# Patient Record
Sex: Female | Born: 1970 | Hispanic: No | Marital: Single | Smoking: Never smoker
Health system: Southern US, Community
[De-identification: ages and names within clinical notes are randomized; demographics above are authoritative.]

---

## 2004-08-08 ENCOUNTER — Ambulatory Visit: Payer: Self-pay | Admitting: Obstetrics & Gynecology

## 2005-03-22 ENCOUNTER — Emergency Department: Payer: Self-pay | Admitting: Emergency Medicine

## 2005-03-28 ENCOUNTER — Emergency Department: Payer: Self-pay | Admitting: Internal Medicine

## 2006-05-07 ENCOUNTER — Emergency Department: Payer: Self-pay | Admitting: Emergency Medicine

## 2006-05-10 ENCOUNTER — Ambulatory Visit: Payer: Self-pay | Admitting: Obstetrics and Gynecology

## 2006-05-20 ENCOUNTER — Emergency Department: Payer: Self-pay | Admitting: Internal Medicine

## 2006-06-17 ENCOUNTER — Emergency Department: Payer: Self-pay | Admitting: Emergency Medicine

## 2006-08-26 ENCOUNTER — Encounter: Payer: Self-pay | Admitting: Maternal & Fetal Medicine

## 2006-10-07 ENCOUNTER — Encounter: Payer: Self-pay | Admitting: Maternal & Fetal Medicine

## 2006-11-18 ENCOUNTER — Encounter: Payer: Self-pay | Admitting: Maternal & Fetal Medicine

## 2007-01-01 ENCOUNTER — Observation Stay: Payer: Self-pay

## 2007-01-09 ENCOUNTER — Inpatient Hospital Stay: Payer: Self-pay

## 2008-04-25 ENCOUNTER — Emergency Department: Payer: Self-pay | Admitting: Emergency Medicine

## 2008-08-14 ENCOUNTER — Ambulatory Visit: Payer: Self-pay | Admitting: Family Medicine

## 2015-10-01 ENCOUNTER — Other Ambulatory Visit: Payer: Self-pay | Admitting: Family Medicine

## 2015-10-01 DIAGNOSIS — Z0000 Encounter for general adult medical examination without abnormal findings: Secondary | ICD-10-CM

## 2015-10-16 ENCOUNTER — Encounter (HOSPITAL_COMMUNITY): Payer: Self-pay

## 2015-10-16 ENCOUNTER — Ambulatory Visit
Admission: RE | Admit: 2015-10-16 | Discharge: 2015-10-16 | Disposition: A | Discharge status: Home / Self Care | Payer: Medicaid Other | Source: Ambulatory Visit | Attending: Family Medicine | Admitting: Family Medicine

## 2015-10-16 DIAGNOSIS — Z1231 Encounter for screening mammogram for malignant neoplasm of breast: Secondary | ICD-10-CM | POA: Diagnosis not present

## 2015-10-16 DIAGNOSIS — Z0000 Encounter for general adult medical examination without abnormal findings: Secondary | ICD-10-CM

## 2017-10-12 ENCOUNTER — Other Ambulatory Visit: Payer: Self-pay

## 2017-10-12 ENCOUNTER — Ambulatory Visit: Payer: Medicaid Other | Attending: Primary Care

## 2017-10-12 DIAGNOSIS — M545 Low back pain, unspecified: Secondary | ICD-10-CM

## 2017-10-12 DIAGNOSIS — G8929 Other chronic pain: Secondary | ICD-10-CM | POA: Diagnosis present

## 2017-10-12 DIAGNOSIS — M62838 Other muscle spasm: Secondary | ICD-10-CM | POA: Insufficient documentation

## 2017-10-12 DIAGNOSIS — M6281 Muscle weakness (generalized): Secondary | ICD-10-CM | POA: Diagnosis present

## 2017-10-12 NOTE — Patient Instructions (Signed)
Access Code: [email or web address]  URL: [email or web address]  Date: 10/12/2017  Prepared by: Olga Coasteriana Tabria Steines   Exercises  Standing Lumbar Extension with Counter - 10 reps - 1 sets - 3x daily - 7x weekly  Lumbar shift to the L 2-3 a day, 7x a week
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
## 2017-10-19 ENCOUNTER — Ambulatory Visit: Payer: Medicaid Other

## 2017-10-21 ENCOUNTER — Ambulatory Visit: Payer: Medicaid Other | Attending: Primary Care

## 2017-10-21 DIAGNOSIS — M62838 Other muscle spasm: Secondary | ICD-10-CM | POA: Diagnosis present

## 2017-10-21 DIAGNOSIS — M545 Low back pain: Secondary | ICD-10-CM | POA: Diagnosis not present

## 2017-10-21 DIAGNOSIS — M6281 Muscle weakness (generalized): Secondary | ICD-10-CM | POA: Diagnosis present

## 2017-10-21 DIAGNOSIS — G8929 Other chronic pain: Secondary | ICD-10-CM | POA: Insufficient documentation

## 2017-10-21 NOTE — Therapy (Signed)
[facility]TS [facility]ICINE [address] [place], [address] Phone: [phone]   Fax:  [phone]  Physical Therapy Treatment  Patient Details  Name: Dawn Oconnor MRN: [id number] Date of Birth: [birth year] Referring Provider (PT): Sandrea Hughs   Encounter Date: 10/21/2017  PT End of Session - 10/21/17 0753    Visit Number  2    Number of Visits  16    Date for PT Re-Evaluation  12/07/17    PT Start Time  0756    PT Stop Time  0849    PT Time Calculation (min)  53 min    Activity Tolerance  Patient limited by pain    Behavior During Therapy  [facility] for tasks assessed/performed       History reviewed. No pertinent past medical history.  History reviewed. No pertinent surgical history.  There were no vitals filed for this visit.  Subjective Assessment - 10/21/17 0757    Subjective  Patient reports that she is doing her HEP, moderate complaints of pain with extension exercise.    Currently in Pain?  Yes    Pain Score  7     Pain Location  Back    Pain Orientation  Lower;Right    Pain Descriptors / Indicators  Sore    Pain Type  Chronic pain    Pain Onset  More than a month ago        Treatment:  Therapeutic exercise:performed with verbal/visual cues from PT, goal; improve mobility, tissue length, core strength  Lumbar shift to L 3 x 30s in standing with demo from PT Extension in standing x10 with demon/verbal cues from PT Hip adduction isometric in s/l 15x3s holds with verbal cues (during modalities) LTR 8x3s holds tactile/verbal cues SKTC 3x20s hold b/l TrA activation in s/l 15x3s (during modalities) TrA activation in supine 5x3s   Modalities: x79mins High volt 100pps 2sec to lumbar area in s/l with moist hot pack(unbilled) for pain management, relaxation, tissue tension   Manual therapy: x56mins STM/IASTM to R posterior/lateral hip and lumbar paraspinals b/l for tissue tension, pain  management  Patient response to treatment: patient demonstrated improved technique with exercises with repeated demonstration and VC. Patient reported decreased tenderness at end of STM/IASTM, as well as decreased pain to 5-6/10 at end of session.    PT Education - 10/21/17 0759    Education Details  therex technique/form, HEP    Person(s) Educated  Patient    Methods  Explanation;Demonstration;Tactile cues    Comprehension  Verbalized understanding       PT Short Term Goals - 10/12/17 1716      PT SHORT TERM GOAL #1   Title  Patient will be complaint with HEP at least 3x a week to demonstrate ability to self manage condition at home.    Baseline  Patient currently unaware of HEP, HEP administered     Time  4    Period  Weeks    Status  New    Target Date  11/09/17        PT Long Term Goals - 10/12/17 1717      PT LONG TERM GOAL #1   Title  Patient will demonstrate lumbar ROM WFLs to improve ability to perform functional activities such as twisting, lifting, bending with pain 4/10 or less.     Baseline  See objective section for current ROM, pain ranges from 6/10-10/10 with movement currently.    Time  8  Period  Weeks    Status  New    Target Date  12/07/17      PT LONG TERM GOAL #2   Title  The patient will demonstrate transfers from sit to stand, and bed mobility with pain 4/10 or less to improve mobility and tolerance to functional activities.    Baseline  Patient unable to perform transfers with pain 4/10 currently.    Time  4    Period  Weeks    Status  New    Target Date  12/07/17      PT LONG TERM GOAL #3   Title  the patient will demonstrate an improvement in functional tasks via improvement in FOTO (outcome measure) by at least 10 points.    Baseline   FOTO 10/12/17: 63    Time  8    Period  Weeks    Status  New    Target Date  12/07/17      PT LONG TERM GOAL #4   Title  The patient will demonstrate at least 4/5 of MMT for LE strength to improve  ability to perform functional activities such as stair navigation, squatting, carrying groceries, etc.    Baseline  see objective section for current MMT results.    Time  8    Period  Weeks    Target Date  12/07/17      PT LONG TERM GOAL #5   Title  Patient will tolerate sitting unsupported demonstrating erect sitting posture with minimal thoracic kyphosis for 20+ minutes with maximum of 5/10 back pain to demonstrate improved back extensor strength and improved sitting tolerance.    Baseline  Currently only able to sit for with back pain 5/10 or less.     Time  8    Period  Weeks    Status  New    Target Date  12/07/17            Plan - 10/21/17 0850    Clinical Impression Statement  Patient demonstrated good understanding of transverse abdominis activation during session. S/L for most of session due to poor tolerance with prone/supine positioning. Patient reported decrease in lumbar pain to 5-6/10 at end of session.     PT Frequency  2x / week    PT Duration  8 weeks    PT Treatment/Interventions  ADLs/Self Care Home Management;Aquatic Therapy;Moist Heat;Parrafin;Gait training;Stair training;Functional mobility training;Neuromuscular re-education;Balance training;Therapeutic exercise;Therapeutic activities;Patient/family education;Scar mobilization;Passive range of motion;Manual techniques;Dry needling;Energy conservation;Joint Manipulations;Splinting;Taping;Spinal Manipulations    PT Next Visit Plan  stretches, light STM     PT Home Exercise Plan  lumbar shift correction, standing extension    Consulted and Agree with Plan of Care  Patient       Patient will benefit from skilled therapeutic intervention in order to improve the following deficits and impairments:  Decreased balance, Decreased endurance, Decreased mobility, Abnormal gait, Hypomobility, Increased muscle spasms, Decreased range of motion, Improper body mechanics, Decreased activity tolerance, Decreased strength,  Increased fascial restricitons, Impaired flexibility, Postural dysfunction, Pain  Visit Diagnosis: Chronic midline low back pain without sciatica  Muscle weakness (generalized)  Other muscle spasm     Problem List There are no active problems to display for this patient.   Olga Coaster PT, DPT 8:55 AM,10/21/17 [phone]  [facility] PHYSICAL AND SPORTS MEDICINE [address] [place], [address] Phone: [phone]   Fax:  [phone]  Name: Dawn Oconnor MRN: [id number] Date of Birth: March 18, 1970

## 2017-10-26 ENCOUNTER — Ambulatory Visit: Payer: Medicaid Other

## 2017-10-26 DIAGNOSIS — M545 Low back pain, unspecified: Secondary | ICD-10-CM

## 2017-10-26 DIAGNOSIS — M6281 Muscle weakness (generalized): Secondary | ICD-10-CM

## 2017-10-26 DIAGNOSIS — G8929 Other chronic pain: Secondary | ICD-10-CM

## 2017-10-26 DIAGNOSIS — M62838 Other muscle spasm: Secondary | ICD-10-CM
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
## 2017-10-28 ENCOUNTER — Ambulatory Visit: Payer: Medicaid Other

## 2017-10-28 DIAGNOSIS — M545 Low back pain: Secondary | ICD-10-CM | POA: Diagnosis not present

## 2017-10-28 DIAGNOSIS — G8929 Other chronic pain: Secondary | ICD-10-CM

## 2017-10-28 DIAGNOSIS — M62838 Other muscle spasm: Secondary | ICD-10-CM

## 2017-10-28 DIAGNOSIS — M6281 Muscle weakness (generalized): Secondary | ICD-10-CM
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
## 2017-11-02 ENCOUNTER — Encounter: Payer: Self-pay | Admitting: Physical Therapy

## 2017-11-02 ENCOUNTER — Ambulatory Visit: Payer: Medicaid Other | Admitting: Physical Therapy

## 2017-11-02 ENCOUNTER — Ambulatory Visit: Payer: Medicaid Other

## 2017-11-02 DIAGNOSIS — M545 Low back pain, unspecified: Secondary | ICD-10-CM

## 2017-11-02 DIAGNOSIS — M6281 Muscle weakness (generalized): Secondary | ICD-10-CM

## 2017-11-02 DIAGNOSIS — G8929 Other chronic pain: Secondary | ICD-10-CM

## 2017-11-02 DIAGNOSIS — M62838 Other muscle spasm: Secondary | ICD-10-CM
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
## 2017-11-04 ENCOUNTER — Ambulatory Visit: Payer: Medicaid Other

## 2017-11-04 ENCOUNTER — Other Ambulatory Visit: Payer: Self-pay

## 2017-11-04 DIAGNOSIS — M545 Low back pain: Principal | ICD-10-CM

## 2017-11-04 DIAGNOSIS — M6281 Muscle weakness (generalized): Secondary | ICD-10-CM

## 2017-11-04 DIAGNOSIS — G8929 Other chronic pain: Secondary | ICD-10-CM

## 2017-11-04 DIAGNOSIS — M62838 Other muscle spasm: Secondary | ICD-10-CM
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
## 2017-11-09 ENCOUNTER — Ambulatory Visit: Payer: Medicaid Other

## 2017-11-09 ENCOUNTER — Other Ambulatory Visit: Payer: Self-pay

## 2017-11-09 DIAGNOSIS — M545 Low back pain: Principal | ICD-10-CM

## 2017-11-09 DIAGNOSIS — G8929 Other chronic pain: Secondary | ICD-10-CM

## 2017-11-09 DIAGNOSIS — M62838 Other muscle spasm: Secondary | ICD-10-CM

## 2017-11-09 DIAGNOSIS — M6281 Muscle weakness (generalized): Secondary | ICD-10-CM
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
## 2017-11-11 ENCOUNTER — Encounter: Payer: Self-pay | Admitting: Physical Therapy

## 2017-11-11 ENCOUNTER — Ambulatory Visit: Payer: Medicaid Other | Admitting: Physical Therapy

## 2017-11-11 DIAGNOSIS — G8929 Other chronic pain: Secondary | ICD-10-CM

## 2017-11-11 DIAGNOSIS — M545 Low back pain: Principal | ICD-10-CM

## 2017-11-11 DIAGNOSIS — M62838 Other muscle spasm: Secondary | ICD-10-CM

## 2017-11-11 DIAGNOSIS — M6281 Muscle weakness (generalized): Secondary | ICD-10-CM
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
## 2017-11-16 ENCOUNTER — Other Ambulatory Visit: Payer: Self-pay

## 2017-11-16 ENCOUNTER — Ambulatory Visit: Payer: Medicaid Other

## 2017-11-16 DIAGNOSIS — M545 Low back pain: Principal | ICD-10-CM

## 2017-11-16 DIAGNOSIS — G8929 Other chronic pain: Secondary | ICD-10-CM

## 2017-11-16 DIAGNOSIS — M62838 Other muscle spasm: Secondary | ICD-10-CM

## 2017-11-16 DIAGNOSIS — M6281 Muscle weakness (generalized): Secondary | ICD-10-CM
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
## 2017-11-18 ENCOUNTER — Encounter: Payer: Self-pay | Admitting: Physical Therapy

## 2017-11-18 ENCOUNTER — Ambulatory Visit: Payer: Medicaid Other | Admitting: Physical Therapy

## 2017-11-18 DIAGNOSIS — M62838 Other muscle spasm: Secondary | ICD-10-CM

## 2017-11-18 DIAGNOSIS — M6281 Muscle weakness (generalized): Secondary | ICD-10-CM

## 2017-11-18 DIAGNOSIS — M545 Low back pain, unspecified: Secondary | ICD-10-CM

## 2017-11-18 DIAGNOSIS — G8929 Other chronic pain: Secondary | ICD-10-CM
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
## 2017-11-23 ENCOUNTER — Other Ambulatory Visit: Payer: Self-pay

## 2017-11-23 ENCOUNTER — Ambulatory Visit: Payer: Medicaid Other | Attending: Primary Care

## 2017-11-23 DIAGNOSIS — M6281 Muscle weakness (generalized): Secondary | ICD-10-CM | POA: Insufficient documentation

## 2017-11-23 DIAGNOSIS — M545 Low back pain, unspecified: Secondary | ICD-10-CM

## 2017-11-23 DIAGNOSIS — G8929 Other chronic pain: Secondary | ICD-10-CM | POA: Diagnosis present

## 2017-11-23 DIAGNOSIS — M62838 Other muscle spasm: Secondary | ICD-10-CM | POA: Diagnosis present
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
## 2017-11-25 ENCOUNTER — Ambulatory Visit: Payer: Medicaid Other | Admitting: Physical Therapy

## 2017-11-25 ENCOUNTER — Ambulatory Visit: Payer: Medicaid Other

## 2017-11-25 ENCOUNTER — Other Ambulatory Visit: Payer: Self-pay

## 2017-11-25 DIAGNOSIS — G8929 Other chronic pain: Secondary | ICD-10-CM

## 2017-11-25 DIAGNOSIS — M62838 Other muscle spasm: Secondary | ICD-10-CM

## 2017-11-25 DIAGNOSIS — M545 Low back pain, unspecified: Secondary | ICD-10-CM

## 2017-11-25 DIAGNOSIS — M6281 Muscle weakness (generalized): Secondary | ICD-10-CM
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
## 2017-11-30 ENCOUNTER — Ambulatory Visit: Payer: Medicaid Other | Admitting: Physical Therapy

## 2017-11-30 ENCOUNTER — Encounter: Payer: Self-pay | Admitting: Physical Therapy

## 2017-11-30 DIAGNOSIS — M545 Low back pain: Secondary | ICD-10-CM | POA: Diagnosis not present
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
## 2017-12-02 ENCOUNTER — Encounter: Payer: Self-pay | Admitting: Physical Therapy

## 2017-12-02 ENCOUNTER — Ambulatory Visit: Payer: Medicaid Other | Admitting: Physical Therapy

## 2017-12-02 ENCOUNTER — Ambulatory Visit: Payer: Medicaid Other

## 2017-12-07 ENCOUNTER — Ambulatory Visit: Payer: Medicaid Other

## 2017-12-07 ENCOUNTER — Other Ambulatory Visit: Payer: Self-pay

## 2017-12-07 DIAGNOSIS — M545 Low back pain: Principal | ICD-10-CM

## 2017-12-07 DIAGNOSIS — G8929 Other chronic pain: Secondary | ICD-10-CM

## 2017-12-07 DIAGNOSIS — M62838 Other muscle spasm: Secondary | ICD-10-CM

## 2017-12-07 DIAGNOSIS — M6281 Muscle weakness (generalized): Secondary | ICD-10-CM
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
## 2017-12-09 ENCOUNTER — Encounter: Payer: Self-pay | Admitting: Physical Therapy

## 2017-12-09 ENCOUNTER — Ambulatory Visit: Payer: Medicaid Other

## 2017-12-09 ENCOUNTER — Ambulatory Visit: Payer: Medicaid Other | Admitting: Physical Therapy

## 2017-12-09 ENCOUNTER — Telehealth: Payer: Self-pay | Admitting: Physical Therapy

## 2017-12-09 DIAGNOSIS — M6281 Muscle weakness (generalized): Secondary | ICD-10-CM

## 2017-12-09 DIAGNOSIS — M62838 Other muscle spasm: Secondary | ICD-10-CM

## 2017-12-09 DIAGNOSIS — M545 Low back pain: Principal | ICD-10-CM

## 2017-12-09 DIAGNOSIS — G8929 Other chronic pain: Secondary | ICD-10-CM

## 2017-12-09 NOTE — Telephone Encounter (Signed)
Called referring provider's office to ask for update on referral to orthopedist/MRI that patient reported she was waiting for. Spoke to personnel who checked her chart and did not find evidence of such referral. Stated Dr. Letta PateAycock may want to call me back to discuss (pt reported Dr. Letta PateAycock was the last person she saw at that office and who told her a referral was being made). Also reported that I was calling due to pt continuing to worsen and let them know I am discharging pt from PT due to lack of progress and had referred her back to them for further evaluation for her condition.
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
## 2018-02-08 DIAGNOSIS — M5440 Lumbago with sciatica, unspecified side: Secondary | ICD-10-CM | POA: Insufficient documentation

## 2019-10-10 ENCOUNTER — Other Ambulatory Visit: Payer: Self-pay | Admitting: Family Medicine

## 2019-10-10 DIAGNOSIS — Z1231 Encounter for screening mammogram for malignant neoplasm of breast: Secondary | ICD-10-CM

## 2019-10-16 ENCOUNTER — Other Ambulatory Visit: Payer: Self-pay | Admitting: Physical Medicine & Rehabilitation

## 2019-10-16 DIAGNOSIS — G8929 Other chronic pain: Secondary | ICD-10-CM

## 2019-10-16 DIAGNOSIS — M5442 Lumbago with sciatica, left side: Secondary | ICD-10-CM

## 2019-10-23 ENCOUNTER — Ambulatory Visit: Payer: Medicaid Other | Attending: Physical Medicine & Rehabilitation

## 2019-10-23 ENCOUNTER — Other Ambulatory Visit: Payer: Self-pay

## 2019-10-23 DIAGNOSIS — M5441 Lumbago with sciatica, right side: Secondary | ICD-10-CM | POA: Insufficient documentation

## 2019-10-23 DIAGNOSIS — G8929 Other chronic pain: Secondary | ICD-10-CM | POA: Insufficient documentation

## 2019-10-23 DIAGNOSIS — M5442 Lumbago with sciatica, left side: Secondary | ICD-10-CM | POA: Insufficient documentation

## 2019-10-23 DIAGNOSIS — M6281 Muscle weakness (generalized): Secondary | ICD-10-CM | POA: Insufficient documentation

## 2019-10-23 DIAGNOSIS — M62838 Other muscle spasm: Secondary | ICD-10-CM | POA: Diagnosis present

## 2019-10-23 DIAGNOSIS — M545 Low back pain, unspecified: Secondary | ICD-10-CM | POA: Diagnosis present

## 2019-10-23 NOTE — Therapy (Signed)
[facility] [address] [place], [place], [address] Phone: [phone]   Fax:  [phone]  Physical Therapy Evaluation  Patient Details  Name: Dawn Oconnor MRN: [id number] Date of Birth: 1970-07-24 Referring Provider (PT): Girtha Hake, MD   Encounter Date: 10/23/2019   PT End of Session - 10/23/19 1431    Visit Number 1    Number of Visits 9    Date for PT Re-Evaluation 11/20/19    PT Start Time 0258    PT Stop Time 1515    PT Time Calculation (min) 44 min    Activity Tolerance Patient limited by pain;Patient tolerated treatment well    Behavior During Therapy [facility] for tasks assessed/performed           History reviewed. No pertinent past medical history.  History reviewed. No pertinent surgical history.  There were no vitals filed for this visit.    Subjective Assessment - 10/23/19 1439    Subjective Pt. is a 49 y.o. female with chronic bilat back pain, 8/10 for about 3-4 years. Pt. additionally has sx that come down both legs, more consistently on the R, but has been starting more on the L. Pt. describes sx as numbness, shooting pain down to knee. Pt. states that her left leg has been buckling for past 6 months. Pt. has had MRI imaging of spine with nothing notable found. Pt. experiences most pain with sitting and supine activities. Pt. states that standing or walking is the most comfortable position. Pt. states she has completed aquatic therapy which was beneficial. Pt. states that she struggles with sleeping, and struggles with urinary incontinence recently. She has not seen an MD for urinary incontinence. Pt. states that gabapentin, voltarin, and naproxen can help her sleep. Pt. works as a Educational psychologist. Pt. states that it is not full time. Pt. states that after about 5-10 mins of sitting or laying down she feels sx starting from low back radiating down to above knees. Pt. additionally states that she feels her  memory has been getting worse recently. Pt. states that occasionally when she's walking or laying down she will feel dizzy, and experience blurred vision.    Pertinent History Pt has 67 year old daughter, works as English as a second language teacher    Limitations Sitting    How long can you sit comfortably? 5-10 mins    Diagnostic tests MRI spine, clear    Patient Stated Goals relieve pain    Currently in Pain? Yes    Pain Score 7     Pain Location Back    Pain Orientation Lower;Medial    Pain Onset More than a month ago                OBJECTIVE  Mental Status Patient is oriented to person, place and time.  Recent memory is intact.  Remote memory is intact.  Attention span and concentration are intact.  Expressive speech is intact.  Patient's fund of knowledge is within normal limits for educational level.  SENSATION: L5,S1 difference side to side   Gait Pt walks with decreased gait speed and decreased step length bilat.   Palpation Concurrent pain with palpation on R and L paraspinals at L3-L5 level  Strength (out of 5) Pt limited by pain in all motions and increase in pain with sitting. Pt demonstrated decreased strength in bilat LE.    AROM (degrees) R/L Lumbar forward flexion: significantly diminished, increase in pain noted. Lumbar extension: significantly  decreased, improves sx.  Lumbar lateral flexion: grossly the same, increase in pain on L side going both directions. Thoracic and Lumbar rotation: decreased bilat, increase in pain on L and R low back.  Repeated Movements Increase in sx with flexion, decrease in sx with repeated ext.    Special Test: Slump test positive bilat.           Objective measurements completed on examination: See above findings.                 PT Short Term Goals - 12/09/17 1154      PT SHORT TERM GOAL #1   Title Patient will be complaint with HEP at least 3x a week to demonstrate ability to self manage condition at  home.    Baseline unable to tolerate due to pain (12/09/2017)    Time 2    Period Weeks    Status Not Met    Target Date 11/22/17             PT Long Term Goals - 10/23/19 1526      PT LONG TERM GOAL #1   Title Pt. will improve sitting tolerance to at least 15 minutes with pain less thatn 4/10 to handle to commute to work.    Baseline 10/4: pt unalbe to sit for more than 1 minute without pain up to 8/10    Time 4    Period Weeks    Status New    Target Date 11/20/19      PT LONG TERM GOAL #2   Title Pt. will be able to lift 20 lbs from the ground to improve ability to complete work related tasks.    Baseline 10/4: pt unalbe to lift 20lbs from ground    Time 4    Period Weeks    Status New    Target Date 11/20/19      PT LONG TERM GOAL #3   Title Pt. will improve FOTO score to 60 to improve pain free functional mobility.    Baseline 10/4: 56    Time 4    Period Weeks    Status New    Target Date 11/20/19      PT LONG TERM GOAL #4   Title Pt. will demonstrate WFL spinal flexion to improve ability to complete ADLs.    Baseline 10/4: pt unable to tie shoes due to inadequate spinal flexion    Time 4    Period Weeks    Status New    Target Date 11/20/19      PT LONG TERM GOAL #5   Time --    Period --    Status --    Target Date --                  Plan - 10/24/19 0818    Clinical Impression Statement Pt. is a 49 y.o. female with c/o of chronic back pain that radiates down both legs, worse on the R than L. Pt. describes severe pain in low back, 8/10. Pt. exhibits differences in sensation at L5 and S1 levels in LE. Pt. is tender to palpation with concurrent pain on R and L paraspinals at L3-L5 level. PT unable to formally assess LE strength due to significant increase in pain with sitting and with all LE motions. Pt. exhibits positive slump test on both LEs. Pt. demonstrates centralization of sx with repeated spinal extension. Pt. has mentioned new onset  urinary incontinecne issues over  the past few weeks. Pt. will receive MRI for her spine on 10/13. Pt. will continue to benefit from skilled PT to improve pain and improve ability to complete ADLs independently.    Examination-Activity Limitations Bed Mobility;Caring for Others;Carry;Squat;Sit    Stability/Clinical Decision Making Evolving/Moderate complexity    Clinical Decision Making Moderate    Rehab Potential Fair    PT Frequency 2x / week    PT Duration 4 weeks    PT Treatment/Interventions ADLs/Self Care Home Management;Aquatic Therapy;Moist Heat;Parrafin;Gait training;Stair training;Functional mobility training;Neuromuscular re-education;Balance training;Therapeutic exercise;Therapeutic activities;Patient/family education;Scar mobilization;Passive range of motion;Manual techniques;Dry needling;Energy conservation;Joint Manipulations;Splinting;Taping;Spinal Manipulations;Cryotherapy    PT Next Visit Plan prone or standing extension    Consulted and Agree with Plan of Care Patient           Patient will benefit from skilled therapeutic intervention in order to improve the following deficits and impairments:  Decreased balance, Decreased endurance, Decreased mobility, Abnormal gait, Hypomobility, Increased muscle spasms, Decreased range of motion, Improper body mechanics, Decreased activity tolerance, Decreased strength, Impaired flexibility, Postural dysfunction, Pain  Visit Diagnosis: Chronic midline low back pain with bilateral sciatica  Muscle weakness (generalized)  Other muscle spasm     Problem List There are no problems to display for this patient.   Carlyle Basques, SPT 10/24/2019, 8:51 AM  [facility] [address] [place], [place], [address] Phone: [phone]   Fax:  [phone]  Name: Dawn Oconnor MRN: [id number] Date of Birth: 23-Apr-1970

## 2019-10-24 ENCOUNTER — Encounter: Payer: Self-pay | Admitting: Physical Therapy

## 2019-10-25 ENCOUNTER — Encounter: Payer: Self-pay | Admitting: Physical Therapy

## 2019-10-25 ENCOUNTER — Ambulatory Visit: Payer: Medicaid Other | Admitting: Physical Therapy

## 2019-10-25 ENCOUNTER — Other Ambulatory Visit: Payer: Self-pay

## 2019-10-25 DIAGNOSIS — M6281 Muscle weakness (generalized): Secondary | ICD-10-CM

## 2019-10-25 DIAGNOSIS — M5442 Lumbago with sciatica, left side: Secondary | ICD-10-CM

## 2019-10-25 DIAGNOSIS — M5441 Lumbago with sciatica, right side: Secondary | ICD-10-CM | POA: Diagnosis not present

## 2019-10-25 DIAGNOSIS — M62838 Other muscle spasm: Secondary | ICD-10-CM

## 2019-10-25 DIAGNOSIS — G8929 Other chronic pain: Secondary | ICD-10-CM

## 2019-10-25 NOTE — Therapy (Signed)
[place] [facility] [address] [place], [place], [address] Phone: [phone]   Fax:  [phone]  Physical Therapy Treatment  Patient Details  Name: Dawn Oconnor MRN: [id number] Date of Birth: 09/27/1970 Referring Provider (PT): Girtha Hake, MD   Encounter Date: 10/25/2019   PT End of Session - 10/25/19 1441    Visit Number 2    Number of Visits 9    Date for PT Re-Evaluation 11/20/19    PT Start Time 5038    PT Stop Time 1515    PT Time Calculation (min) 38 min    Activity Tolerance Patient limited by pain;Patient tolerated treatment well    Behavior During Therapy [facility] for tasks assessed/performed           History reviewed. No pertinent past medical history.  History reviewed. No pertinent surgical history.  There were no vitals filed for this visit.   Subjective Assessment - 10/25/19 1438    Subjective Pt. states that her pain is 6/10 in low back. Not currently feeling any sx down her legs. Earlier today, she felt radicular sx down her R leg during standing while doing dishes.    Pertinent History Pt has 75 year old daughter, works as English as a second language teacher    Limitations Sitting    How long can you sit comfortably? 5-10 mins    Diagnostic tests MRI spine, clear    Patient Stated Goals relieve pain    Currently in Pain? Yes    Pain Score 6     Pain Location Back    Pain Orientation Lower;Medial    Pain Descriptors / Indicators Aching;Tingling    Pain Onset More than a month ago           Therex:   Prone pushups: pt only able to lift head and shoulders off mat. Pt reported varying areas of pain while in prone, moved between upper thoracic paraspinals and L low back muscles that began radicular pain. PT attempted placing pillow under forearms to decrease pain felt with upper thoracic spine by pushing up, no relief found. PT attempted STM to lumbar and thoracic paraspinals where pt was experiencing pain. Directed  deep breathing helped pt with pain the most. Discontinued due to no relief and increase in pain over entire back.   Standing spinal extension hands on wall: pt attempted twice, increased pain and radicular sx. Discontinued due to pain.  Standing rolling green theraball up wall: pt experienced slight centralization of sx with this activity. Completed 10x with 5 sec holds.   Standing ext against green theraball on wall: 10x 5 sec holds. Cueing for deep breathing throughout.        PT Short Term Goals - 51/21/19 1154      PT SHORT TERM GOAL #1   Title Patient will be complaint with HEP at least 3x a week to demonstrate ability to self manage condition at home.    Baseline unable to tolerate due to pain (12/09/2017)    Time 2    Period Weeks    Status Not Met    Target Date 11/22/49             PT Long Term Goals - 10/23/19 1526      PT LONG TERM GOAL #1   Title Pt. will demonstrate handling at least 15 minutes of sitting with pain less than 4/10 to handle to commute to work.    Baseline 10/4: pt unalbe to sit for  more than 5 minute without pain up to 8/10    Time 4    Period Weeks    Status New    Target Date 11/20/19      PT LONG TERM GOAL #2   Title Pt. will be able to lift 20 lbs from the ground to improve ability to complete work related tasks.    Baseline 10/4: pt unalbe to lift 20lbs from ground    Time 4    Period Weeks    Status New    Target Date 11/20/19      PT LONG TERM GOAL #3   Title Pt. will improve FOTO score to 60 to improve pain free functional mobility.    Baseline 10/4: 56    Time 4    Period Weeks    Status New    Target Date 11/20/19      PT LONG TERM GOAL #4   Title Pt. will demonstrate WFL spinal flexion to improve ability to complete ADLs.    Baseline 10/4: pt unable to tie shoes due to inadequate spinal flexion    Time 4    Period Weeks    Status New    Target Date 11/20/19      PT LONG TERM GOAL #5   Time --    Period --     Status --    Target Date --                 Plan - 10/25/19 1520    Clinical Impression Statement Pt. returns to therapy with no change in sx. Pt. demonstrated improved ability to sit and lie prone during today's session. Pt attempted prone push ups and standing facing wall lumbar ext with no resolution of sx. Pt. experienced centralization of sx with standing rolling theraball up wall and ball on wall spinal extension. Pt. will receive MRI next week of spine. Pt. will continue to benefit from skilled PT to improve pain and improve ability to complete ADLs independently.    Personal Factors and Comorbidities Time since onset of injury/illness/exacerbation    Examination-Activity Limitations Bed Mobility;Caring for Others;Carry;Squat;Sit    Examination-Participation Restrictions Cleaning;Community Activity;Laundry    Stability/Clinical Decision Making Evolving/Moderate complexity    Clinical Decision Making Moderate    Rehab Potential Fair    PT Frequency 2x / week    PT Duration 4 weeks    PT Treatment/Interventions ADLs/Self Care Home Management;Aquatic Therapy;Moist Heat;Parrafin;Gait training;Stair training;Functional mobility training;Neuromuscular re-education;Balance training;Therapeutic exercise;Therapeutic activities;Patient/family education;Scar mobilization;Passive range of motion;Manual techniques;Dry needling;Energy conservation;Joint Manipulations;Splinting;Taping;Spinal Manipulations;Cryotherapy    PT Next Visit Plan prone or standing extension    Consulted and Agree with Plan of Care Patient           Patient will benefit from skilled therapeutic intervention in order to improve the following deficits and impairments:  Decreased balance, Decreased endurance, Decreased mobility, Abnormal gait, Hypomobility, Increased muscle spasms, Decreased range of motion, Improper body mechanics, Decreased activity tolerance, Decreased strength, Impaired flexibility, Postural  dysfunction, Pain  Visit Diagnosis: Chronic midline low back pain with bilateral sciatica  Muscle weakness (generalized)  Other muscle spasm  Chronic midline low back pain without sciatica     Problem List There are no problems to display for this patient.  Cammie Mcgee, PT, DPT # [id number] Sharyn Creamer, SPT 10/26/2019, 12:39 PM  [place] [facility] [address] [place], [place], [address] Phone: [phone]   Fax:  [phone]  Name: Dawn Linden  Oconnor MRN: [id number] Date of Birth: 03-27-1970

## 2019-10-30 ENCOUNTER — Encounter: Payer: Self-pay | Admitting: Physical Therapy

## 2019-10-30 ENCOUNTER — Other Ambulatory Visit: Payer: Self-pay

## 2019-10-30 ENCOUNTER — Ambulatory Visit: Payer: Medicaid Other

## 2019-10-30 DIAGNOSIS — M5442 Lumbago with sciatica, left side: Secondary | ICD-10-CM

## 2019-10-30 DIAGNOSIS — G8929 Other chronic pain: Secondary | ICD-10-CM

## 2019-10-30 DIAGNOSIS — M6281 Muscle weakness (generalized): Secondary | ICD-10-CM

## 2019-10-30 DIAGNOSIS — M62838 Other muscle spasm: Secondary | ICD-10-CM

## 2019-10-30 DIAGNOSIS — M5441 Lumbago with sciatica, right side: Secondary | ICD-10-CM | POA: Diagnosis not present

## 2019-10-30 NOTE — Therapy (Signed)
[place] [facility] [address] [place], [place], [address] Phone: [phone]   Fax:  [phone]  Physical Therapy Treatment  Patient Details  Name: Dawn Oconnor MRN: [id number] Date of Birth: 05/22/1970 Referring Provider (PT): Girtha Hake, MD   Encounter Date: 10/30/2019   PT End of Session - 10/30/19 3009    Visit Number 3    Number of Visits 9    Date for PT Re-Evaluation 11/20/19    PT Start Time 2330    PT Stop Time 0762    PT Time Calculation (min) 42 min    Activity Tolerance Patient limited by pain;Patient tolerated treatment well    Behavior During Therapy [facility] for tasks assessed/performed           History reviewed. No pertinent past medical history.  History reviewed. No pertinent surgical history.  There were no vitals filed for this visit.   Subjective Assessment - 10/30/19 1435    Subjective Pt. states she was able to work on her exercises. Pt. states her back pain is about 6/10. Pt. states she was feeling a lot of pain this morning. Pt. states she is still taking her naproxen and gabapentin and that it helps.    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 5-10 mins    Diagnostic tests MRI spine, clear    Patient Stated Goals relieve pain    Currently in Pain? Yes    Pain Score 6     Pain Location Back    Pain Orientation Right;Medial    Pain Descriptors / Indicators Aching    Pain Onset More than a month ago            There Ex:  Nustep 10 mins L0. Required cueing to maintain motion and breathing techniques throughout.   Seated physioball core strengthening: pt required significant verbal and tactile cueing for deep breathing and for relaxing shoulders and back muscles throughout all activities. Pt instructed to pair all movements with breathing.   Seated posture education: significant verbal and tactile  cueing. Pt instructed to close eyes and feel shoulder and back muscles paired with deep breathing. Pt took standing rest breaks between each activity, during standing rest breaks pt completed standing spinal ext repetitions.   Seated weight shifting: 10x each way.   Seated marches: 15x each leg.   Seated knee extension: 5x each leg, increased pain therefore discontinued.   Standing spinal extension with green theraball on wall: 20x. Pt reports centralization of sx.   Standing rolling green threball up wall: 15x. Pt instructed to increase ab contraction throughout activity, significant cueing for breathing throughout. Pt additionally cued for only going through pain free motion.                     PT Education - 10/30/19 1520    Education Details deep breathing exercises    Person(s) Educated Patient    Methods Explanation;Demonstration    Comprehension Verbalized understanding;Returned demonstration            PT Short Term Goals - 12/09/17 1154      PT SHORT TERM GOAL #1   Title Patient will be complaint with HEP at least 3x a week to demonstrate ability to self manage condition at home.    Baseline unable to tolerate due to pain (12/09/2017)  Time 2    Period Weeks    Status Not Met    Target Date 11/22/17             PT Long Term Goals - 10/23/19 1526      PT LONG TERM GOAL #1   Title Pt. will demonstrate handling at least 15 minutes of sitting with pain less than 4/10 to handle to commute to work.    Baseline 10/4: pt unalbe to sit for more than 5 minute without pain up to 8/10    Time 4    Period Weeks    Status New    Target Date 11/20/19      PT LONG TERM GOAL #2   Title Pt. will be able to lift 20 lbs from the ground to improve ability to complete work related tasks.    Baseline 10/4: pt unalbe to lift 20lbs from ground    Time 4    Period Weeks    Status New    Target Date 11/20/19      PT LONG TERM GOAL #3   Title Pt. will improve  FOTO score to 60 to improve pain free functional mobility.    Baseline 10/4: 56    Time 4    Period Weeks    Status New    Target Date 11/20/19      PT LONG TERM GOAL #4   Title Pt. will demonstrate WFL spinal flexion to improve ability to complete ADLs.    Baseline 10/4: pt unable to tie shoes due to inadequate spinal flexion    Time 4    Period Weeks    Status New    Target Date 11/20/19      PT LONG TERM GOAL #5   Time --    Period --    Status --    Target Date --                 Plan - 10/30/19 1514    Clinical Impression Statement Pt. returns to therapy with no change in sx. Pt. focused on seated core strengthening activities on blue theraball. Pt. required significant verbal and tactile cueing throughout session to relax shoulders and back muscles and for deep breathing exercises. Pt. still experiences difficulty with transitions between sitting and standing, pt unable to complete without some level of holding her breath despite significant verbal cueing for breathing throughout motion. Pt. still responds well to standing repeated spinal ext for centralization of sx. Pt. is receiving MRI on 10/13. Pt. will continue to benefit from skilled PT to improve pain and improve ability to complete ADLs independently.    Personal Factors and Comorbidities Time since onset of injury/illness/exacerbation    Examination-Activity Limitations Bed Mobility;Caring for Others;Carry;Squat;Sit    Examination-Participation Restrictions Cleaning;Community Activity;Laundry    Stability/Clinical Decision Making Evolving/Moderate complexity    Clinical Decision Making Moderate    Rehab Potential Fair    PT Frequency 2x / week    PT Duration 4 weeks    PT Treatment/Interventions ADLs/Self Care Home Management;Aquatic Therapy;Moist Heat;Parrafin;Gait training;Stair training;Functional mobility training;Neuromuscular re-education;Balance training;Therapeutic exercise;Therapeutic  activities;Patient/family education;Scar mobilization;Passive range of motion;Manual techniques;Dry needling;Energy conservation;Joint Manipulations;Splinting;Taping;Spinal Manipulations;Cryotherapy    PT Next Visit Plan prone or standing extension    Consulted and Agree with Plan of Care Patient           Patient will benefit from skilled therapeutic intervention in order to improve the following deficits and impairments:  Decreased balance, Decreased endurance,  Decreased mobility, Abnormal gait, Hypomobility, Increased muscle spasms, Decreased range of motion, Improper body mechanics, Decreased activity tolerance, Decreased strength, Impaired flexibility, Postural dysfunction, Pain  Visit Diagnosis: Chronic midline low back pain with bilateral sciatica  Muscle weakness (generalized)  Other muscle spasm  Chronic midline low back pain without sciatica     Problem List There are no problems to display for this patient.   Carlyle Basques, SPT 10/30/2019, 3:21 PM  [facility] [address] [place], [place], [address] Phone: [phone]   Fax:  [phone]  Name: Dawn Oconnor MRN: [id number] Date of Birth: April 18, 1970

## 2019-10-30 NOTE — Patient Instructions (Signed)
Pt. Instructed on completing deep breathing exercises at home

## 2019-11-01 ENCOUNTER — Ambulatory Visit
Admission: RE | Admit: 2019-11-01 | Discharge: 2019-11-01 | Disposition: A | Discharge status: Home / Self Care | Payer: Medicaid Other | Source: Ambulatory Visit | Attending: Physical Medicine & Rehabilitation | Admitting: Physical Medicine & Rehabilitation

## 2019-11-01 ENCOUNTER — Other Ambulatory Visit: Payer: Self-pay

## 2019-11-01 DIAGNOSIS — M5441 Lumbago with sciatica, right side: Secondary | ICD-10-CM | POA: Insufficient documentation

## 2019-11-01 DIAGNOSIS — G8929 Other chronic pain: Secondary | ICD-10-CM | POA: Diagnosis present

## 2019-11-01 DIAGNOSIS — M5442 Lumbago with sciatica, left side: Secondary | ICD-10-CM | POA: Insufficient documentation

## 2019-11-02 ENCOUNTER — Encounter: Payer: Self-pay | Admitting: Physical Therapy

## 2019-11-02 ENCOUNTER — Ambulatory Visit: Payer: Medicaid Other

## 2019-11-02 DIAGNOSIS — M48061 Spinal stenosis, lumbar region without neurogenic claudication: Secondary | ICD-10-CM | POA: Insufficient documentation

## 2019-11-02 DIAGNOSIS — M6281 Muscle weakness (generalized): Secondary | ICD-10-CM

## 2019-11-02 DIAGNOSIS — G8929 Other chronic pain: Secondary | ICD-10-CM

## 2019-11-02 DIAGNOSIS — M62838 Other muscle spasm: Secondary | ICD-10-CM

## 2019-11-02 DIAGNOSIS — M5441 Lumbago with sciatica, right side: Secondary | ICD-10-CM | POA: Diagnosis not present

## 2019-11-02 DIAGNOSIS — M545 Low back pain, unspecified: Secondary | ICD-10-CM

## 2019-11-02 NOTE — Therapy (Signed)
[facility] [address] [place], [place], [address] Phone: [phone]   Fax:  [phone]  Physical Therapy Treatment  Patient Details  Name: Dawn Oconnor MRN: [id number] Date of Birth: 11-Jun-1970 Referring Provider (PT): Girtha Hake, MD   Encounter Date: 11/02/2019   PT End of Session - 11/02/19 1347    Visit Number 4    Number of Visits 9    Date for PT Re-Evaluation 11/20/19    PT Start Time 9476    PT Stop Time 1420    PT Time Calculation (min) 35 min    Activity Tolerance Patient limited by pain;Patient tolerated treatment well    Behavior During Therapy [facility] for tasks assessed/performed           History reviewed. No pertinent past medical history.  History reviewed. No pertinent surgical history.  There were no vitals filed for this visit.   Subjective Assessment - 11/02/19 1346    Subjective Pt. received MRI yesterday, will see MD today to discuss results. Pt. states her back pain is about 7-8/10 today. She states her arms are a bit sore as well.    Pertinent History Pt has 61 year old daughter, works as English as a second language teacher    Limitations Sitting    How long can you sit comfortably? 5-10 mins    Diagnostic tests MRI spine, clear    Patient Stated Goals relieve pain    Currently in Pain? Yes    Pain Score 8     Pain Location Back    Pain Orientation Lower    Pain Descriptors / Indicators Aching    Pain Onset More than a month ago            There Ex:   Nustep 5 mins (unbilled)   Pt required significant verbal and tactile cueing for deep breathing and for relaxing shoulders and back muscles throughout all activities.   Seated physioball core strengthening: . Pt instructed to pair all movements with breathing.               Seated posture education: keeping shoulders relaxed              Seated marches: 15x each leg.               Seated knee extension: 10x each leg               Seated chair  push up to stretch low back: 10x   STS practice pairing with breathing: 10x, significant cueing pairing movement with breathing  Neuro Re-ed:  Seated LE nerve glides bliat: 10x each leg. Cueing for maintaining pain free ROM.                           PT Short Term Goals - 12/09/17 1154      PT SHORT TERM GOAL #1   Title Patient will be complaint with HEP at least 3x a week to demonstrate ability to self manage condition at home.    Baseline unable to tolerate due to pain (12/09/2017)    Time 2    Period Weeks    Status Not Met    Target Date 11/22/17             PT Long Term Goals - 10/23/19 1526      PT LONG TERM GOAL #1   Title Pt. will demonstrate handling at least 15 minutes  of sitting with pain less than 4/10 to handle to commute to work.    Baseline 10/4: pt unalbe to sit for more than 5 minute without pain up to 8/10    Time 4    Period Weeks    Status New    Target Date 11/20/19      PT LONG TERM GOAL #2   Title Pt. will be able to lift 20 lbs from the ground to improve ability to complete work related tasks.    Baseline 10/4: pt unalbe to lift 20lbs from ground    Time 4    Period Weeks    Status New    Target Date 11/20/19      PT LONG TERM GOAL #3   Title Pt. will improve FOTO score to 60 to improve pain free functional mobility.    Baseline 10/4: 56    Time 4    Period Weeks    Status New    Target Date 11/20/19      PT LONG TERM GOAL #4   Title Pt. will demonstrate WFL spinal flexion to improve ability to complete ADLs.    Baseline 10/4: pt unable to tie shoes due to inadequate spinal flexion    Time 4    Period Weeks    Status New    Target Date 11/20/19      PT LONG TERM GOAL #5   Time --    Period --    Status --    Target Date --                 Plan - 11/02/19 1428    Clinical Impression Statement Pt. states back pain is a bit worse today, however throughout session pain decreased to 5-6/10. Pt.  demonstrated improved ability to relax shoulders throughout activities. Pt. still required significant cueing for pairing breathing with exercises, however was better able to complete throughout session. Pt. responded well to seated chair push up to stretch low back with decrease in sx. Pt. will see MD today to discuss results of MRI. Pt. will continue to benefit from skilled PT to improve pain to decrease sx and improve ability to complete ADLs independently.    Personal Factors and Comorbidities Time since onset of injury/illness/exacerbation    Examination-Activity Limitations Bed Mobility;Caring for Others;Carry;Squat;Sit    Examination-Participation Restrictions Cleaning;Community Activity;Laundry    Stability/Clinical Decision Making Evolving/Moderate complexity    Clinical Decision Making Moderate    Rehab Potential Fair    PT Frequency 2x / week    PT Duration 4 weeks    PT Treatment/Interventions ADLs/Self Care Home Management;Aquatic Therapy;Moist Heat;Parrafin;Gait training;Stair training;Functional mobility training;Neuromuscular re-education;Balance training;Therapeutic exercise;Therapeutic activities;Patient/family education;Scar mobilization;Passive range of motion;Manual techniques;Dry needling;Energy conservation;Joint Manipulations;Splinting;Taping;Spinal Manipulations;Cryotherapy    PT Next Visit Plan prone or standing extension    Consulted and Agree with Plan of Care Patient           Patient will benefit from skilled therapeutic intervention in order to improve the following deficits and impairments:  Decreased balance, Decreased endurance, Decreased mobility, Abnormal gait, Hypomobility, Increased muscle spasms, Decreased range of motion, Improper body mechanics, Decreased activity tolerance, Decreased strength, Impaired flexibility, Postural dysfunction, Pain  Visit Diagnosis: Chronic midline low back pain with bilateral sciatica  Muscle weakness (generalized)  Other  muscle spasm  Chronic midline low back pain without sciatica     Problem List There are no problems to display for this patient.   Carlyle Basques, SPT 11/02/2019, 2:35  PM  [place] [facility] [address] [place], [place], [address] Phone: [phone]   Fax:  [phone]  Name: Elidia Bonenfant MRN: [id number] Date of Birth: Mar 03, 1970

## 2019-11-06 ENCOUNTER — Ambulatory Visit: Payer: Medicaid Other

## 2019-11-06 ENCOUNTER — Encounter: Payer: Self-pay | Admitting: Physical Therapy

## 2019-11-06 ENCOUNTER — Other Ambulatory Visit: Payer: Self-pay

## 2019-11-06 DIAGNOSIS — M62838 Other muscle spasm: Secondary | ICD-10-CM

## 2019-11-06 DIAGNOSIS — M5441 Lumbago with sciatica, right side: Secondary | ICD-10-CM

## 2019-11-06 DIAGNOSIS — M6281 Muscle weakness (generalized): Secondary | ICD-10-CM

## 2019-11-06 DIAGNOSIS — G8929 Other chronic pain: Secondary | ICD-10-CM

## 2019-11-06 NOTE — Therapy (Signed)
[place] [facility] [address] [place], [place], [address] Phone: [phone]   Fax:  [phone]  Physical Therapy Treatment  Patient Details  Name: Dawn Oconnor MRN: [id number] Date of Birth: [birth year] Referring Provider (PT): Girtha Hake, MD   Encounter Date: 11/06/2019   PT End of Session - 11/06/19 1439    Visit Number 5    Number of Visits 9    Date for PT Re-Evaluation 11/20/19    PT Start Time 0867    PT Stop Time 1515    PT Time Calculation (min) 43 min    Activity Tolerance Patient limited by pain;Patient tolerated treatment well    Behavior During Therapy [facility] for tasks assessed/performed           History reviewed. No pertinent past medical history.  History reviewed. No pertinent surgical history.  There were no vitals filed for this visit.   Subjective Assessment - 11/06/19 1438    Subjective Pt. states that she was able to walk for about 15-20 mins, helped to decrease pain after. Pt. states that back pain is currently about 5-6/10.    Pertinent History Pt has 19 year old daughter, works as English as a second language teacher    Limitations Sitting    How long can you sit comfortably? 5-10 mins    Diagnostic tests MRI spine, clear    Patient Stated Goals relieve pain    Currently in Pain? Yes    Pain Score 5     Pain Location Back    Pain Orientation Lower;Right;Left    Pain Descriptors / Indicators Aching    Pain Type Chronic pain    Pain Onset More than a month ago            There Ex:  Nustep L1 10 mins: not billed.   Sit to stand practice: mod cueing for breathing and sitting technique  Seated forward stretch with chair in front of // bar, reaching forward to grab bar: 5x15 second holds.   Standing trunk rotation against YTB: 15x each direction. Pt required cueing for isolating trunk rotation only and not using arms to complete rotation. Pt additionally instructed for only completing in pain free  ROM  HEP check: LE nerve glides: 10x each leg.   Scapular retractions YTB 2x15, cued for core, posture, technique  shoulder extensions with TYB x2x15 cued for core acitvation, posture, technique, breathing.   seated therapy ball pelvic rocks: laterally, anterior/posterior clockwise,, counter clockwise x30 seconds   Seated marching 2x15, rest in between sets. Pt needed tactile, verbal, and visual cues for exericse form, breathing technique, and to decreased trunk tension                             PT Short Term Goals - 12/09/17 1154      PT SHORT TERM GOAL #1   Title Patient will be complaint with HEP at least 3x a week to demonstrate ability to self manage condition at home.    Baseline unable to tolerate due to pain (12/09/2017)    Time 2    Period Weeks    Status Not Met    Target Date 11/22/17             PT Long Term Goals - 10/23/19 1526      PT LONG TERM GOAL #1   Title Pt. will demonstrate handling at least 15 minutes of sitting with pain  less than 4/10 to handle to commute to work.    Baseline 10/4: pt unalbe to sit for more than 5 minute without pain up to 8/10    Time 4    Period Weeks    Status New    Target Date 11/20/19      PT LONG TERM GOAL #2   Title Pt. will be able to lift 20 lbs from the ground to improve ability to complete work related tasks.    Baseline 10/4: pt unalbe to lift 20lbs from ground    Time 4    Period Weeks    Status New    Target Date 11/20/19      PT LONG TERM GOAL #3   Title Pt. will improve FOTO score to 60 to improve pain free functional mobility.    Baseline 10/4: 56    Time 4    Period Weeks    Status New    Target Date 11/20/19      PT LONG TERM GOAL #4   Title Pt. will demonstrate WFL spinal flexion to improve ability to complete ADLs.    Baseline 10/4: pt unable to tie shoes due to inadequate spinal flexion    Time 4    Period Weeks    Status New    Target Date 11/20/19      PT LONG  TERM GOAL #5   Time --    Period --    Status --    Target Date --                 Plan - 11/07/19 0741    Clinical Impression Statement Pt. returned to therapy after MRI and follow up visit with MD. Pt. states that the MD suggested steroid injections, however due to a previous negative experience with steroid injections she is not interested in them at this time, and would like to continue with physical therapy. Pt's pain at start of session was 5-6/10, reduced to 4/10 by end of session. Pt. completed various standing and seated core strengthening exercises, still requiring mod-max verbal cueing for breathing throughout exercises. Pt. will continue to benefit from skilled PT to improve pain and decrease radicular sx to improve ability to complete ADLs without pain.    Personal Factors and Comorbidities Time since onset of injury/illness/exacerbation    Examination-Activity Limitations Bed Mobility;Caring for Others;Carry;Squat;Sit    Examination-Participation Restrictions Cleaning;Community Activity;Laundry    Stability/Clinical Decision Making Evolving/Moderate complexity    Clinical Decision Making Moderate    Rehab Potential Fair    PT Frequency 2x / week    PT Duration 4 weeks    PT Treatment/Interventions ADLs/Self Care Home Management;Aquatic Therapy;Moist Heat;Parrafin;Gait training;Stair training;Functional mobility training;Neuromuscular re-education;Balance training;Therapeutic exercise;Therapeutic activities;Patient/family education;Scar mobilization;Passive range of motion;Manual techniques;Dry needling;Energy conservation;Joint Manipulations;Splinting;Taping;Spinal Manipulations;Cryotherapy    PT Next Visit Plan prone or standing extension    Consulted and Agree with Plan of Care Patient           Patient will benefit from skilled therapeutic intervention in order to improve the following deficits and impairments:  Decreased balance, Decreased endurance, Decreased  mobility, Abnormal gait, Hypomobility, Increased muscle spasms, Decreased range of motion, Improper body mechanics, Decreased activity tolerance, Decreased strength, Impaired flexibility, Postural dysfunction, Pain  Visit Diagnosis: Chronic midline low back pain with bilateral sciatica  Muscle weakness (generalized)  Other muscle spasm  Chronic midline low back pain without sciatica     Problem List There are no problems to display for  this patient.   Carlyle Basques, SPT 11/07/2019, 7:46 AM   [facility] [address] [place], [place], [address] Phone: [phone]   Fax:  [phone]  Name: Dawn Oconnor MRN: [id number] Date of Birth: [birth year]

## 2019-11-08 ENCOUNTER — Other Ambulatory Visit: Payer: Self-pay

## 2019-11-08 ENCOUNTER — Ambulatory Visit: Payer: Medicaid Other | Admitting: Physical Therapy

## 2019-11-08 ENCOUNTER — Encounter: Payer: Self-pay | Admitting: Physical Therapy

## 2019-11-08 DIAGNOSIS — M5441 Lumbago with sciatica, right side: Secondary | ICD-10-CM | POA: Diagnosis not present

## 2019-11-08 DIAGNOSIS — M62838 Other muscle spasm: Secondary | ICD-10-CM

## 2019-11-08 DIAGNOSIS — G8929 Other chronic pain: Secondary | ICD-10-CM

## 2019-11-08 DIAGNOSIS — M6281 Muscle weakness (generalized): Secondary | ICD-10-CM

## 2019-11-08 NOTE — Therapy (Signed)
[facility] [address] [place], [place], [address] Phone: [phone]   Fax:  [phone]  Physical Therapy Treatment  Patient Details  Name: Dawn Oconnor MRN: [id number] Date of Birth: August 24, 1970 Referring Provider (PT): Filomena Jungling, MD   Encounter Date: 11/08/2019   PT End of Session - 11/08/19 1443    Visit Number 6    Number of Visits 9    Date for PT Re-Evaluation 11/20/19    Authorization Type medicaid    Authorization - Visit Number 6    Authorization - Number of Visits 10    PT Start Time 1432    PT Stop Time 1514    PT Time Calculation (min) 42 min    Activity Tolerance Patient limited by pain;Patient tolerated treatment well    Behavior During Therapy [facility] for tasks assessed/performed           History reviewed. No pertinent past medical history.  History reviewed. No pertinent surgical history.  There were no vitals filed for this visit.   Subjective Assessment - 11/08/19 1440    Subjective Pt. states that her R foot and leg were about 8/10 pain. Pt. states that she attempted walking yesterday and it was very painful. Pt. states that her back is not in pain at this time.    Pertinent History Pt has 70 year old daughter, works as Nurse, mental health    Limitations Sitting    How long can you sit comfortably? 5-10 mins    Diagnostic tests MRI spine, clear    Patient Stated Goals relieve pain    Currently in Pain? Yes    Pain Score 6     Pain Location Leg    Pain Orientation Right;Upper;Lower    Pain Descriptors / Indicators Aching    Pain Onset More than a month ago           There Ex:  Nustep L0 10 mins: pt states reduction in pain after nustep, initially 8/10, reduced to 1-2/10 pain in leg.   No charge Standing 4lb weighted ball chest press: 10x. Verbal cueing for slowing down and increasing core contraction   Standing 4lb weighted ball overhead press: 10x. Verbal cueing for slowing down  and increasing core contraction.    Standing trunk rotations against YTB: 12x each direction. Verbal cueing for keeping hands still and isolating trunk rotation.   Standing rolling green theraball up wall: 15x, cueing for maintaining slight tension in abdominals.   Standing spinal extension against ball: 15x, cueing for decreasing speed and pausing in extension. Holding for 5 seconds in ext for last 5 reps.   Neuro re-ed:   Seated R LE nerve glides: 2x10 reps. Cueing for decreasing speed.         PT Short Term Goals - 12/09/17 1154      PT SHORT TERM GOAL #1   Title Patient will be complaint with HEP at least 3x a week to demonstrate ability to self manage condition at home.    Baseline unable to tolerate due to pain (12/09/2017)    Time 2    Period Weeks    Status Not Met    Target Date 11/22/17             PT Long Term Goals - 10/23/19 1526      PT LONG TERM GOAL #1   Title Pt. will demonstrate handling at least 15 minutes of sitting with pain less than 4/10 to  handle to commute to work.    Baseline 10/4: pt unalbe to sit for more than 5 minute without pain up to 8/10    Time 4    Period Weeks    Status New    Target Date 11/20/19      PT LONG TERM GOAL #2   Title Pt. will be able to lift 20 lbs from the ground to improve ability to complete work related tasks.    Baseline 10/4: pt unalbe to lift 20lbs from ground    Time 4    Period Weeks    Status New    Target Date 11/20/19      PT LONG TERM GOAL #3   Title Pt. will improve FOTO score to 60 to improve pain free functional mobility.    Baseline 10/4: 56    Time 4    Period Weeks    Status New    Target Date 11/20/19      PT LONG TERM GOAL #4   Title Pt. will demonstrate WFL spinal flexion to improve ability to complete ADLs.    Baseline 10/4: pt unable to tie shoes due to inadequate spinal flexion    Time 4    Period Weeks    Status New    Target Date 11/20/19      PT LONG TERM GOAL #5   Time --     Period --    Status --    Target Date --                 Plan - 11/08/19 1510    Clinical Impression Statement Pt. returns to therapy with slightly increased R leg pain, but decreased back pain. Pt. completed standing core strengthening exercises with no increases in pain. Pt. completed seated R LE nerve glides which helped with nerve symptoms. Pt. continues to dislike sitting positions due to pain. Pt. will conitnue to benefit from skilled PT to improve pain and decrease radicular sx to improve ability to complete ADLs without pain.    Personal Factors and Comorbidities Time since onset of injury/illness/exacerbation    Examination-Activity Limitations Bed Mobility;Caring for Others;Carry;Squat;Sit    Examination-Participation Restrictions Cleaning;Community Activity;Laundry    Stability/Clinical Decision Making Evolving/Moderate complexity    Clinical Decision Making Moderate    Rehab Potential Fair    PT Frequency 2x / week    PT Duration 4 weeks    PT Treatment/Interventions ADLs/Self Care Home Management;Aquatic Therapy;Moist Heat;Parrafin;Gait training;Stair training;Functional mobility training;Neuromuscular re-education;Balance training;Therapeutic exercise;Therapeutic activities;Patient/family education;Scar mobilization;Passive range of motion;Manual techniques;Dry needling;Energy conservation;Joint Manipulations;Splinting;Taping;Spinal Manipulations;Cryotherapy    PT Next Visit Plan update HEP, 15 mins seated exercises    Consulted and Agree with Plan of Care Patient           Patient will benefit from skilled therapeutic intervention in order to improve the following deficits and impairments:  Decreased balance, Decreased endurance, Decreased mobility, Abnormal gait, Hypomobility, Increased muscle spasms, Decreased range of motion, Improper body mechanics, Decreased activity tolerance, Decreased strength, Impaired flexibility, Postural dysfunction, Pain  Visit  Diagnosis: Chronic midline low back pain with bilateral sciatica  Muscle weakness (generalized)  Other muscle spasm  Chronic midline low back pain without sciatica     Problem List There are no problems to display for this patient.  Pura Spice, PT, DPT # [id number] Carlyle Basques, SPT 11/09/2019, 8:49 AM  [facility] [address] [place], [place], [address] Phone: [phone]  Fax:  [phone]  Name: Dawn Oconnor MRN: [id number] Date of Birth: 13-Aug-1970

## 2019-11-13 ENCOUNTER — Other Ambulatory Visit: Payer: Self-pay

## 2019-11-13 ENCOUNTER — Encounter: Payer: Self-pay | Admitting: Physical Therapy

## 2019-11-13 ENCOUNTER — Ambulatory Visit: Payer: Medicaid Other

## 2019-11-13 DIAGNOSIS — M5441 Lumbago with sciatica, right side: Secondary | ICD-10-CM | POA: Diagnosis not present

## 2019-11-13 DIAGNOSIS — M62838 Other muscle spasm: Secondary | ICD-10-CM

## 2019-11-13 DIAGNOSIS — M6281 Muscle weakness (generalized): Secondary | ICD-10-CM

## 2019-11-13 DIAGNOSIS — G8929 Other chronic pain: Secondary | ICD-10-CM

## 2019-11-13 DIAGNOSIS — M545 Low back pain, unspecified: Secondary | ICD-10-CM

## 2019-11-13 NOTE — Therapy (Signed)
[place] [facility] [address] [place], [place], [address] Phone: [phone]   Fax:  [phone]  Physical Therapy Treatment  Patient Details  Name: Dawn Oconnor MRN: [id number] Date of Birth: 1970/10/18 Referring Provider (PT): Girtha Hake, MD   Encounter Date: 11/13/2019   PT End of Session - 11/13/19 1445    Visit Number 7    Number of Visits 9    Date for PT Re-Evaluation 11/20/19    Authorization Type medicaid    Authorization - Visit Number 6    Authorization - Number of Visits 10    PT Start Time 0109    PT Stop Time 3235    PT Time Calculation (min) 43 min    Activity Tolerance Patient limited by pain;Patient tolerated treatment well    Behavior During Therapy [facility] for tasks assessed/performed           History reviewed. No pertinent past medical history.  History reviewed. No pertinent surgical history.  There were no vitals filed for this visit.   Subjective Assessment - 11/13/19 1440    Subjective Pt. states that pain is more centralized 8/10 in low back, radiating slightly to R thigh.    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 5-10 mins    Diagnostic tests MRI spine, clear    Patient Stated Goals relieve pain    Currently in Pain? Yes    Pain Score 8     Pain Location Back    Pain Orientation Right    Pain Descriptors / Indicators Aching    Pain Onset More than a month ago           There Ex:  Nustep L0 10 mins (unbilled)  TrA Activation progression: Required max verbal and tactile cueing to isolate TrA and to maintain breathing throughout session  Supine TrA activation: 10x. Added in posterior pelvic tilts for additional 10x reps.   Supine TrA marches: 2x10 each leg. To assist with breathing pairing, PT assisted motion and verbally cued pt when to breathe  Supine TrA dead bugs: 2x10 each pair. Pt  required verbal cueing to maintain opposite limb pairing.  Log roll technique: pt instructed on breathing through log roll technique  HEP demonstration for standing trunk rotation against YTB in door: pt completed approx 5 reps of trunk rotation with YTB around door handle to complete at home.                             PT Short Term Goals - 12/09/17 1154      PT SHORT TERM GOAL #1   Title Patient will be complaint with HEP at least 3x a week to demonstrate ability to self manage condition at home.    Baseline unable to tolerate due to pain (12/09/2017)    Time 2    Period Weeks    Status Not Met    Target Date 11/22/17             PT Long Term Goals - 10/23/19 1526      PT LONG TERM GOAL #1   Title Pt. will demonstrate handling at least 15 minutes of sitting with pain less than 4/10 to handle to commute to work.    Baseline 10/4: pt unalbe to sit for more than 5 minute  without pain up to 8/10    Time 4    Period Weeks    Status New    Target Date 11/20/19      PT LONG TERM GOAL #2   Title Pt. will be able to lift 20 lbs from the ground to improve ability to complete work related tasks.    Baseline 10/4: pt unalbe to lift 20lbs from ground    Time 4    Period Weeks    Status New    Target Date 11/20/19      PT LONG TERM GOAL #3   Title Pt. will improve FOTO score to 60 to improve pain free functional mobility.    Baseline 10/4: 56    Time 4    Period Weeks    Status New    Target Date 11/20/19      PT LONG TERM GOAL #4   Title Pt. will demonstrate WFL spinal flexion to improve ability to complete ADLs.    Baseline 10/4: pt unable to tie shoes due to inadequate spinal flexion    Time 4    Period Weeks    Status New    Target Date 11/20/19      PT LONG TERM GOAL #5   Time --    Period --    Status --    Target Date --                 Plan - 11/13/19 1521    Clinical Impression Statement Pt. completed supine mat exercises  today. Pt. previously has not been able to lie supine in clinic due to pain. Pt. presented to therapy with more centralized sx today in low back. Pt. completed TrA activation exercises, required significant verbal and tactile cueing to complete isolated TrA contraction. Pt. additionally required significant cueing to maintain breathing throughout all of session. Pt. will continue to benefit from skilled PT to improve TrA strength and decrease low back pain to improve independence with ADLs and pain free functional mobility.    Personal Factors and Comorbidities Time since onset of injury/illness/exacerbation    Examination-Activity Limitations Bed Mobility;Caring for Others;Carry;Squat;Sit    Examination-Participation Restrictions Cleaning;Community Activity;Laundry    Stability/Clinical Decision Making Evolving/Moderate complexity    Clinical Decision Making Moderate    Rehab Potential Fair    PT Frequency 2x / week    PT Duration 4 weeks    PT Treatment/Interventions ADLs/Self Care Home Management;Aquatic Therapy;Moist Heat;Parrafin;Gait training;Stair training;Functional mobility training;Neuromuscular re-education;Balance training;Therapeutic exercise;Therapeutic activities;Patient/family education;Scar mobilization;Passive range of motion;Manual techniques;Dry needling;Energy conservation;Joint Manipulations;Splinting;Taping;Spinal Manipulations;Cryotherapy    PT Next Visit Plan update HEP, 15 mins seated exercises    Consulted and Agree with Plan of Care Patient           Patient will benefit from skilled therapeutic intervention in order to improve the following deficits and impairments:  Decreased balance, Decreased endurance, Decreased mobility, Abnormal gait, Hypomobility, Increased muscle spasms, Decreased range of motion, Improper body mechanics, Decreased activity tolerance, Decreased strength, Impaired flexibility, Postural dysfunction, Pain  Visit Diagnosis: Chronic midline low  back pain with bilateral sciatica  Muscle weakness (generalized)  Other muscle spasm  Chronic midline low back pain without sciatica     Problem List There are no problems to display for this patient.   Carlyle Basques, SPT 11/13/2019, 3:41 PM  [facility] [address] [place], [place], [address] Phone: [phone]   Fax:  [phone]  Name: Dawn Oconnor  MRN: [id number] Date of Birth: [birth year]

## 2019-11-15 ENCOUNTER — Other Ambulatory Visit: Payer: Self-pay

## 2019-11-15 ENCOUNTER — Ambulatory Visit: Payer: Medicaid Other | Admitting: Physical Therapy

## 2019-11-15 ENCOUNTER — Encounter: Payer: Self-pay | Admitting: Physical Therapy

## 2019-11-15 DIAGNOSIS — G8929 Other chronic pain: Secondary | ICD-10-CM

## 2019-11-15 DIAGNOSIS — M545 Low back pain, unspecified: Secondary | ICD-10-CM

## 2019-11-15 DIAGNOSIS — M6281 Muscle weakness (generalized): Secondary | ICD-10-CM

## 2019-11-15 DIAGNOSIS — M5441 Lumbago with sciatica, right side: Secondary | ICD-10-CM | POA: Diagnosis not present

## 2019-11-15 DIAGNOSIS — M5442 Lumbago with sciatica, left side: Secondary | ICD-10-CM

## 2019-11-15 DIAGNOSIS — M62838 Other muscle spasm: Secondary | ICD-10-CM

## 2019-11-15 NOTE — Therapy (Signed)
[facility] [address] [place], [place], [address] Phone: [phone]   Fax:  [phone]  Physical Therapy Treatment  Patient Details  Name: Dawn Oconnor MRN: [id number] Date of Birth: February 18, 1970 Referring Provider (PT): Girtha Hake, MD   Encounter Date: 11/15/2019   PT End of Session - 11/15/19 1435    Visit Number 8    Number of Visits 9    Date for PT Re-Evaluation 11/20/19    Authorization Type medicaid    Authorization - Visit Number 8    Authorization - Number of Visits 10    PT Start Time 1430    PT Stop Time 2774    PT Time Calculation (min) 47 min    Activity Tolerance Patient limited by pain;Patient tolerated treatment well    Behavior During Therapy [facility] for tasks assessed/performed           History reviewed. No pertinent past medical history.  History reviewed. No pertinent surgical history.  There were no vitals filed for this visit.   Subjective Assessment - 11/15/19 1433    Subjective Pt. states her R leg is feeling pain all the way down to toes, 6/10 pain. Low back is 5/10 pain. Pt experienceing pain/tightness in shoulders/neck 9/10.    Pertinent History Pt has 80 year old daughter, works as English as a second language teacher    Limitations Sitting    How long can you sit comfortably? 5-10 mins    Diagnostic tests MRI spine, clear    Patient Stated Goals relieve pain    Currently in Pain? Yes   scores listed above   Pain Onset More than a month ago             There Ex:   Nustep 10 mins L1-2, continued cueing for breathing throughout activity.   Standing straight arm 6lb ball raise overhead: cueing for core tight and keeping arms straight. 8x. Pt experienced increase in pain in shoulders therefore discontinued  Rolling green ball up wall: 15x. Cueing to hold ball up wall in full spinal ext for 5-10 sec each rep. Pt states this felt good for her back.   Manual:   27 mins STM to bilat upper traps  and cervical paraspinals. Cueing for deep breathing throughout manual therapy. Pt able to relax and pain decreased to 3/10.         PT Short Term Goals - 12/09/17 1154      PT SHORT TERM GOAL #1   Title Patient will be complaint with HEP at least 3x a week to demonstrate ability to self manage condition at home.    Baseline unable to tolerate due to pain (12/09/2017)    Time 2    Period Weeks    Status Not Met    Target Date 11/22/17             PT Long Term Goals - 10/23/19 1526      PT LONG TERM GOAL #1   Title Pt. will demonstrate handling at least 15 minutes of sitting with pain less than 4/10 to handle to commute to work.    Baseline 10/4: pt unalbe to sit for more than 5 minute without pain up to 8/10    Time 4    Period Weeks    Status New    Target Date 11/20/19      PT LONG TERM GOAL #2   Title Pt. will be able to lift 20 lbs from the  ground to improve ability to complete work related tasks.    Baseline 10/4: pt unalbe to lift 20lbs from ground    Time 4    Period Weeks    Status New    Target Date 11/20/19      PT LONG TERM GOAL #3   Title Pt. will improve FOTO score to 60 to improve pain free functional mobility.    Baseline 10/4: 56    Time 4    Period Weeks    Status New    Target Date 11/20/19      PT LONG TERM GOAL #4   Title Pt. will demonstrate WFL spinal flexion to improve ability to complete ADLs.    Baseline 10/4: pt unable to tie shoes due to inadequate spinal flexion    Time 4    Period Weeks    Status New    Target Date 11/20/19      PT LONG TERM GOAL #5   Time --    Period --    Status --    Target Date --                 Plan - 11/15/19 1514    Clinical Impression Statement Pt. decreased pain overall today after session, with reduction in pain in low back to 4/10, neck and shoulder pain to 3/10, and leg pain is only down to thigh at 5-6/10. Pt. responded very well to STM to upper traps and cervical paraspinals with  instructions to maintain deep breathing. Pt. completed standing straight arm raises with 6lb ball with focus on eccentric control down with cueing for maintaining core tightness. Pt. continues to respond well to repeated spinal extension for decreasing pain. Pt. will continue to benefit from skilled PT to decrease low back pain and radicular sx to improve independence with ADLs and pain free functional mobility.    Personal Factors and Comorbidities Time since onset of injury/illness/exacerbation    Examination-Activity Limitations Bed Mobility;Caring for Others;Carry;Squat;Sit    Examination-Participation Restrictions Cleaning;Community Activity;Laundry    Stability/Clinical Decision Making Evolving/Moderate complexity    Clinical Decision Making Moderate    Rehab Potential Fair    PT Frequency 2x / week    PT Duration 4 weeks    PT Treatment/Interventions ADLs/Self Care Home Management;Aquatic Therapy;Moist Heat;Parrafin;Gait training;Stair training;Functional mobility training;Neuromuscular re-education;Balance training;Therapeutic exercise;Therapeutic activities;Patient/family education;Scar mobilization;Passive range of motion;Manual techniques;Dry needling;Energy conservation;Joint Manipulations;Splinting;Taping;Spinal Manipulations;Cryotherapy    PT Next Visit Plan update HEP, 15 mins seated exercises    Consulted and Agree with Plan of Care Patient           Patient will benefit from skilled therapeutic intervention in order to improve the following deficits and impairments:  Decreased balance, Decreased endurance, Decreased mobility, Abnormal gait, Hypomobility, Increased muscle spasms, Decreased range of motion, Improper body mechanics, Decreased activity tolerance, Decreased strength, Impaired flexibility, Postural dysfunction, Pain  Visit Diagnosis: Chronic midline low back pain with bilateral sciatica  Muscle weakness (generalized)  Other muscle spasm  Chronic midline low back  pain without sciatica     Problem List There are no problems to display for this patient.  Pura Spice, PT, DPT # [id number], SPT 11/16/2019, 8:00 AM  [place] [facility] [address] [place], [place], [address] Phone: [phone]   Fax:  [phone]  Name: Dawn Oconnor MRN: [id number] Date of Birth: 1970/02/02

## 2019-11-16 ENCOUNTER — Ambulatory Visit
Admission: RE | Admit: 2019-11-16 | Discharge: 2019-11-16 | Disposition: A | Discharge status: Home / Self Care | Payer: Medicaid Other | Source: Ambulatory Visit | Attending: Family Medicine | Admitting: Family Medicine

## 2019-11-16 DIAGNOSIS — Z1231 Encounter for screening mammogram for malignant neoplasm of breast: Secondary | ICD-10-CM | POA: Diagnosis not present

## 2019-11-20 ENCOUNTER — Ambulatory Visit: Payer: Medicaid Other | Attending: Physical Medicine & Rehabilitation

## 2019-11-20 ENCOUNTER — Other Ambulatory Visit: Payer: Self-pay

## 2019-11-20 ENCOUNTER — Encounter: Payer: Self-pay | Admitting: Physical Therapy

## 2019-11-20 DIAGNOSIS — M62838 Other muscle spasm: Secondary | ICD-10-CM | POA: Diagnosis present

## 2019-11-20 DIAGNOSIS — M5442 Lumbago with sciatica, left side: Secondary | ICD-10-CM | POA: Insufficient documentation

## 2019-11-20 DIAGNOSIS — M545 Low back pain, unspecified: Secondary | ICD-10-CM | POA: Diagnosis present

## 2019-11-20 DIAGNOSIS — M5441 Lumbago with sciatica, right side: Secondary | ICD-10-CM | POA: Insufficient documentation

## 2019-11-20 DIAGNOSIS — M6281 Muscle weakness (generalized): Secondary | ICD-10-CM | POA: Insufficient documentation

## 2019-11-20 DIAGNOSIS — G8929 Other chronic pain: Secondary | ICD-10-CM | POA: Diagnosis present

## 2019-11-20 NOTE — Therapy (Signed)
[place] [facility] [address] [place], [place], [address] Phone: [phone]   Fax:  [phone]  Physical Therapy Treatment and Recertification Recertifcation Period 63/07/8586 through 11/20/2019  Patient Details  Name: Dawn Oconnor MRN: [id number] Date of Birth: Aug 06, 1970 Referring Provider (PT): Girtha Hake, MD   Encounter Date: 11/20/2019   PT End of Session - 11/20/19 1443    Visit Number 9    Number of Visits 17    Date for PT Re-Evaluation 12/18/19    Authorization Type medicaid    Authorization - Visit Number 9    Authorization - Number of Visits 10    PT Start Time 1430    PT Stop Time 1513    PT Time Calculation (min) 43 min    Activity Tolerance Patient limited by pain;Patient tolerated treatment well    Behavior During Therapy [facility] for tasks assessed/performed           History reviewed. No pertinent past medical history.  History reviewed. No pertinent surgical history.  There were no vitals filed for this visit.   Subjective Assessment - 11/20/19 1441    Subjective Pt. states that her back is painful today, but no sx down the legs.    Pertinent History Pt has 60 year old daughter, works as English as a second language teacher    Limitations Sitting    How long can you sit comfortably? 5-10 mins    Diagnostic tests MRI spine, clear    Patient Stated Goals relieve pain    Currently in Pain? Yes    Pain Score 8     Pain Location Back    Pain Orientation Other (Comment);Lower   middle   Pain Descriptors / Indicators Aching    Pain Onset More than a month ago            There Ex:  Nustep L2 10 mins (unbilled)   Goal reassessment:  FOTO: 59/60  Lifting 20 lbs: unable to at this time, able to pick up box from chair with no weight in box.  Spinal AROM: Still significantly limited in flexion.  Box lifting technique: pt completed 20-30 reps with no weight in box lifting from chair height. Pt initially practiced squat  technique, unable to complete well despite significant verbal and tactile cueing. Attempted to teach squat with STS technique, pt better able to understand hip hinge technique. With significant verbal cueing to bring box close to her body and use legs to lift, pt able to complete approx 10 reps with good lifting technique.   Rolling green ball up wall: 10x. Cueing to hold ball up wall in full spinal ext for 5-10 sec each rep. Pt states this felt good for her back    Pt decreased to 6/10 pain by end of session.                            PT Short Term Goals - 12/09/17 1154      PT SHORT TERM GOAL #1   Title Patient will be complaint with HEP at least 3x a week to demonstrate ability to self manage condition at home.    Baseline unable to tolerate due to pain (12/09/2017)    Time 2    Period Weeks    Status Not Met    Target Date 11/22/17             PT Long Term Goals - 11/20/19 1449  PT LONG TERM GOAL #1   Title Pt. will demonstrate handling at least 15 minutes of sitting with pain less than 4/10 to handle to commute to work.    Baseline 10/4: pt unalbe to sit for more than 5 minute without pain up to 8/10. 11/1: pt able to sit for approx 15 mins with pain no more than 5-7/10    Time 4    Period Weeks    Status Partially Met    Target Date 12/18/19      PT LONG TERM GOAL #2   Title Pt. will be able to lift 20 lbs from the ground to improve ability to complete work related tasks.    Baseline 10/4: pt unalbe to lift 20lbs from ground. 11/1: pt able to lift 7lb box from chair with good technique.    Time 4    Period Weeks    Status Partially Met    Target Date 12/18/19      PT LONG TERM GOAL #3   Title Pt. will improve FOTO score to 60 to improve pain free functional mobility.    Baseline 10/4: 56; 11/1: 59    Time 4    Period Weeks    Status Partially Met    Target Date 12/18/19      PT LONG TERM GOAL #4   Title Pt. will demonstrate WFL  spinal flexion to improve ability to complete ADLs.    Baseline 10/4: pt unable to tie shoes due to inadequate spinal flexion. 11/1: pt still unable to tie shoes due to inadequate spinal flexion. Spinal ext. looks improved.    Time 4    Period Weeks    Status Partially Met    Target Date 12/18/19                 Plan - 11/20/19 1516    Clinical Impression Statement Pt. arrived to therapy with 8/10 pain in low back, no radicular sx. By end of session, pain reduced to 6/10. Pt. reassesed goals today, pt has made progress towards all goals but has not achieved any at this time. See updated goals for details. Pt. heavily educated on lifting technique today, unable to lift from floor at this time but was able to complete approx 10 total reps with good squat lifting technique. Pt. continues to require heavy cueing for pairing breathing with movements. Pt. states she feels she is making progress with therapy. Pt. will continue to benefit from skilled PT to decrease low back pain and improve ability to complete pain free functional mobility.    Personal Factors and Comorbidities Time since onset of injury/illness/exacerbation    Examination-Activity Limitations Bed Mobility;Caring for Others;Carry;Squat;Sit    Examination-Participation Restrictions Cleaning;Community Activity;Laundry    Stability/Clinical Decision Making Evolving/Moderate complexity    Clinical Decision Making Moderate    Rehab Potential Fair    PT Frequency 2x / week    PT Duration 4 weeks    PT Treatment/Interventions ADLs/Self Care Home Management;Aquatic Therapy;Moist Heat;Parrafin;Gait training;Stair training;Functional mobility training;Neuromuscular re-education;Balance training;Therapeutic exercise;Therapeutic activities;Patient/family education;Scar mobilization;Passive range of motion;Manual techniques;Dry needling;Energy conservation;Joint Manipulations;Splinting;Taping;Spinal Manipulations;Cryotherapy    PT Next Visit  Plan update HEP, 15 mins seated exercises    Consulted and Agree with Plan of Care Patient           Patient will benefit from skilled therapeutic intervention in order to improve the following deficits and impairments:  Decreased balance, Decreased endurance, Decreased mobility, Abnormal gait, Hypomobility, Increased muscle spasms, Decreased range  of motion, Improper body mechanics, Decreased activity tolerance, Decreased strength, Impaired flexibility, Postural dysfunction, Pain  Visit Diagnosis: Chronic midline low back pain with bilateral sciatica  Muscle weakness (generalized)  Other muscle spasm  Chronic midline low back pain without sciatica     Problem List There are no problems to display for this patient.   Carlyle Basques, SPT 11/20/2019, 3:22 PM  [place] [facility] [address] [place], [place], [address] Phone: [phone]   Fax:  [phone]  Name: Shameika Speelman MRN: [id number] Date of Birth: [birth year]

## 2019-11-22 ENCOUNTER — Encounter: Payer: Self-pay | Admitting: Physical Therapy

## 2019-11-22 ENCOUNTER — Other Ambulatory Visit: Payer: Self-pay

## 2019-11-22 ENCOUNTER — Ambulatory Visit: Payer: Medicaid Other

## 2019-11-22 DIAGNOSIS — M5441 Lumbago with sciatica, right side: Secondary | ICD-10-CM

## 2019-11-22 DIAGNOSIS — M62838 Other muscle spasm: Secondary | ICD-10-CM

## 2019-11-22 DIAGNOSIS — M6281 Muscle weakness (generalized): Secondary | ICD-10-CM

## 2019-11-22 DIAGNOSIS — M545 Low back pain, unspecified: Secondary | ICD-10-CM

## 2019-11-22 DIAGNOSIS — G8929 Other chronic pain: Secondary | ICD-10-CM

## 2019-11-22 NOTE — Therapy (Signed)
[facility] [address] [place], [place], [address] Phone: [phone]   Fax:  [phone]  Physical Therapy Treatment  Physical Therapy Progress Note   Dates of reporting period  10/23/2019 To 11/22/2019  Patient Details  Name: Dawn Oconnor MRN: [id number] Date of Birth: September 17, 1970 Referring Provider (PT): Filomena Jungling, MD   Encounter Date: 11/22/2019   PT End of Session - 11/22/19 1445     Visit Number 10    Number of Visits 17    Date for PT Re-Evaluation 12/18/19    Authorization Type medicaid    Authorization - Visit Number 10    Authorization - Number of Visits 10    PT Start Time 1442    PT Stop Time 1515    PT Time Calculation (min) 33 min    Activity Tolerance Patient limited by pain;Patient tolerated treatment well    Behavior During Therapy [facility] for tasks assessed/performed             History reviewed. No pertinent past medical history.  History reviewed. No pertinent surgical history.  There were no vitals filed for this visit.   Subjective Assessment - 11/22/19 1444     Subjective Pt. states that she walked a bit yesterday. Pt. states that her back and radicular sx down R leg are 8/10.    Pertinent History Pt has 42 year old daughter, works as Nurse, mental health    Limitations Sitting    How long can you sit comfortably? 5-10 mins    Diagnostic tests MRI spine, clear    Patient Stated Goals relieve pain    Currently in Pain? Yes    Pain Score 8     Pain Location Back    Pain Orientation Right    Pain Descriptors / Indicators Aching    Pain Onset More than a month ago              There Ex:   Pt late to appointment, shortened accordingly.  Rolling green ball up wall: 2x10. Cueing to hold ball up wall in full spinal ext for 5-10 sec each rep. Pt states this felt good for her back              Standing spinal extension on green ball: 20x. Cueing for breathing throughout activity. Pt  states that radicular sx decreased after completing reps.   Nautilus 30lb(single arm) bilat straight arm pull down: cueing for increasing eccentric portion of exercise and keeping core tight. 2x15  Nautilus 30lb (single arm) side stepping: 5x, progressed to paloff press for last rep, both directions     Nautilus trunk rotations 20 lbs (single arm): 10x each direction. Cueing for improved eccentric control and to decrease lateral leaning.   Pt states that pain decreased to 6/10 after session, no radicular sx.                                                  PT Short Term Goals - 12/09/17 1154       PT SHORT TERM GOAL #1   Title Patient will be complaint with HEP at least 3x a week to demonstrate ability to self manage condition at home.    Baseline unable to tolerate due to pain (12/09/2017)    Time 2    Period Weeks  Status Not Met    Target Date 11/22/17                PT Long Term Goals - 11/20/19 1449       PT LONG TERM GOAL #1   Title Pt. will demonstrate handling at least 15 minutes of sitting with pain less than 4/10 to handle to commute to work.    Baseline 10/4: pt unalbe to sit for more than 5 minute without pain up to 8/10. 11/1: pt able to sit for approx 15 mins with pain no more than 5-7/10    Time 4    Period Weeks    Status Partially Met    Target Date 12/18/19      PT LONG TERM GOAL #2   Title Pt. will be able to lift 20 lbs from the ground to improve ability to complete work related tasks.    Baseline 10/4: pt unalbe to lift 20lbs from ground. 11/1: pt able to lift 7lb box from chair with good technique.    Time 4    Period Weeks    Status Partially Met    Target Date 12/18/19      PT LONG TERM GOAL #3   Title Pt. will improve FOTO score to 60 to improve pain free functional mobility.    Baseline 10/4: 56; 11/1: 59    Time 4    Period Weeks    Status Partially Met    Target Date 12/18/19      PT LONG TERM GOAL #4    Title Pt. will demonstrate WFL spinal flexion to improve ability to complete ADLs.    Baseline 10/4: pt unable to tie shoes due to inadequate spinal flexion. 11/1: pt still unable to tie shoes due to inadequate spinal flexion. Spinal ext. looks improved.    Time 4    Period Weeks    Status Partially Met    Target Date 12/18/19                        Plan - 11/22/19 1455     Clinical Impression Statement Pt. progress assessed today. Goals updated last session. Pt. continues to demonstrate slow improvements in pain overall. She conitnues to require less and less verbal cueing for breathing with familiar exercises, however with new exercises requires mod-max verbal cueing to pair breathing with activities. Pt. returns to therapy today with increased pain in back and radicular sx down R LE. After standing spinal ext on green theraball, radicular sx were reduced. Pt. completed various core strenthening exercises with nautilus such as paloff presses, straight arm pull downs, and standing trunk rotations. Pt. reduced to 6/10 pain after session with no radicular sx. Pt. will continue to benefit from skilled PT to improve strength of core muslces to decrease back pain and improve ability to complete pain free ADLs.    Personal Factors and Comorbidities Time since onset of injury/illness/exacerbation    Examination-Activity Limitations Bed Mobility;Caring for Others;Carry;Squat;Sit    Examination-Participation Restrictions Cleaning;Community Activity;Laundry    Stability/Clinical Decision Making Evolving/Moderate complexity    Clinical Decision Making Moderate    Rehab Potential Fair    PT Frequency 2x / week    PT Duration 4 weeks    PT Treatment/Interventions ADLs/Self Care Home Management;Aquatic Therapy;Moist Heat;Parrafin;Gait training;Stair training;Functional mobility training;Neuromuscular re-education;Balance training;Therapeutic exercise;Therapeutic activities;Patient/family  education;Scar mobilization;Passive range of motion;Manual techniques;Dry needling;Energy conservation;Joint Manipulations;Splinting;Taping;Spinal Manipulations;Cryotherapy    PT Next Visit Plan update HEP,  15 mins seated exercises    Consulted and Agree with Plan of Care Patient             Patient will benefit from skilled therapeutic intervention in order to improve the following deficits and impairments:  Decreased balance, Decreased endurance, Decreased mobility, Abnormal gait, Hypomobility, Increased muscle spasms, Decreased range of motion, Improper body mechanics, Decreased activity tolerance, Decreased strength, Impaired flexibility, Postural dysfunction, Pain  Visit Diagnosis: Chronic midline low back pain with bilateral sciatica  Muscle weakness (generalized)  Other muscle spasm  Chronic midline low back pain without sciatica      Problem List There are no problems to display for this patient.   Carlyle Basques, SPT 11/22/2019, 3:24 PM  [place] [facility] [address] [place], [place], [address] Phone: [phone]   Fax:  [phone]  Name: Roselia Snipe MRN: [id number] Date of Birth: 1970-07-02

## 2019-11-27 ENCOUNTER — Other Ambulatory Visit: Payer: Self-pay

## 2019-11-27 ENCOUNTER — Ambulatory Visit: Payer: Medicaid Other | Admitting: Physical Therapy

## 2019-11-27 ENCOUNTER — Encounter: Payer: Self-pay | Admitting: Physical Therapy

## 2019-11-27 DIAGNOSIS — G8929 Other chronic pain: Secondary | ICD-10-CM

## 2019-11-27 DIAGNOSIS — M62838 Other muscle spasm: Secondary | ICD-10-CM

## 2019-11-27 DIAGNOSIS — M545 Low back pain, unspecified: Secondary | ICD-10-CM

## 2019-11-27 DIAGNOSIS — M5441 Lumbago with sciatica, right side: Secondary | ICD-10-CM | POA: Diagnosis not present

## 2019-11-27 DIAGNOSIS — M6281 Muscle weakness (generalized): Secondary | ICD-10-CM

## 2019-11-27 NOTE — Therapy (Signed)
[facility] [address] [place], [place], [address] Phone: [phone]   Fax:  [phone]  Physical Therapy Treatment  Patient Details  Name: Dawn Oconnor MRN: [id number] Date of Birth: 29-Mar-1970 Referring Provider (PT): Girtha Hake, MD   Encounter Date: 11/27/2019   PT End of Session - 11/27/19 1434    Visit Number 11    Number of Visits 17    Date for PT Re-Evaluation 12/18/19    Authorization Type medicaid    Authorization - Visit Number 1    Authorization - Number of Visits 10    PT Start Time 1740    PT Stop Time 1510    PT Time Calculation (min) 39 min    Activity Tolerance Patient limited by pain;Patient tolerated treatment well    Behavior During Therapy [facility] for tasks assessed/performed           History reviewed. No pertinent past medical history.  History reviewed. No pertinent surgical history.  There were no vitals filed for this visit.   Subjective Assessment - 11/27/19 1431    Subjective Pt. states that she did not do too much over the weekned. Pt. states she is experiencing radicular sx down R leg, 7/10.    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 5-10 mins    Diagnostic tests MRI spine, clear    Patient Stated Goals relieve pain    Currently in Pain? Yes    Pain Score 7     Pain Location Back    Pain Orientation Right    Pain Descriptors / Indicators Aching    Pain Onset More than a month ago            There Ex:  Nustep L2 10 mins (unbilled)   Supine TrA marches: Heavy cueing for breaking down steps of activity. PT had pt restate steps of TrA activation first, relaxing head and shoulders, and then breathing while lifting either leg. Pt able to complete multiple reps after heavy education on maintaining TrA activation with posterior pelvic tilts. Without heavy PT supervision and cueing for  pt to recite steps, pt returned to not breathing throughout exercise, losing TrA activation, and keeping head and shoulders tense. Pt educated on log roll technique to get out of bed after, required heavy verbal cueing to maintain breathing throughout activity.    Rolling green ball up wall: 2x10. Cueing to hold ball up wall in full spinal ext for 5-10 sec each rep. Pt states this felt good for her back  Standing spinal extension on green ball: 20x. Cueing for breathing throughout activity. Pt states that radicular sx decreased after completing reps.   STS: practice with same steps listed for supine TrA marches. Pt additionally required cueing for increasing control of sit to decrease jarring sensation associated with plopping into a chair. Pt completed approx 15 STS.         PT Long Term Goals - 11/20/19 1449      PT LONG TERM GOAL #1   Title Pt. will demonstrate handling at least 15 minutes of sitting with pain less than 4/10 to handle to commute to work.    Baseline 10/4: pt unalbe to sit for more than 5 minute without pain up to 8/10. 11/1: pt able to sit for approx 15 mins with pain no more than 5-7/10  Time 4    Period Weeks    Status Partially Met    Target Date 12/18/19      PT LONG TERM GOAL #2   Title Pt. will be able to lift 20 lbs from the ground to improve ability to complete work related tasks.    Baseline 10/4: pt unalbe to lift 20lbs from ground. 11/1: pt able to lift 7lb box from chair with good technique.    Time 4    Period Weeks    Status Partially Met    Target Date 12/18/19      PT LONG TERM GOAL #3   Title Pt. will improve FOTO score to 60 to improve pain free functional mobility.    Baseline 10/4: 56; 11/1: 59    Time 4    Period Weeks    Status Partially Met    Target Date 12/18/19      PT LONG TERM GOAL #4   Title Pt. will demonstrate WFL spinal flexion to improve ability to complete ADLs.    Baseline 10/4: pt unable to tie shoes due to  inadequate spinal flexion. 11/1: pt still unable to tie shoes due to inadequate spinal flexion. Spinal ext. looks improved.    Time 4    Period Weeks    Status Partially Met    Target Date 12/18/19                 Plan - 11/27/19 1514    Clinical Impression Statement Pt. began session with 7/10 pain in low back and radicular sx all the way down R leg. Pt. continues to demonstrate improvements in relaxation of shoulders throughout different activities. Pt. continues to require significant cueing to slow down activities, sync breathing with activity, and to activate TrA throughout activities. Pt. heavily educated on completing more activity at home to maintain decrease in pain made in therapy. Pt. ended session with 5/10 pain and no radicular sx. Pt. will continue to benefit from skilled PT to decrease back pain and improve ability to complete pain free ADLs.    Personal Factors and Comorbidities Time since onset of injury/illness/exacerbation    Examination-Activity Limitations Bed Mobility;Caring for Others;Carry;Squat;Sit    Examination-Participation Restrictions Cleaning;Community Activity;Laundry    Stability/Clinical Decision Making Evolving/Moderate complexity    Clinical Decision Making Moderate    Rehab Potential Fair    PT Frequency 2x / week    PT Duration 4 weeks    PT Treatment/Interventions ADLs/Self Care Home Management;Aquatic Therapy;Moist Heat;Parrafin;Gait training;Stair training;Functional mobility training;Neuromuscular re-education;Balance training;Therapeutic exercise;Therapeutic activities;Patient/family education;Scar mobilization;Passive range of motion;Manual techniques;Dry needling;Energy conservation;Joint Manipulations;Splinting;Taping;Spinal Manipulations;Cryotherapy    PT Next Visit Plan Reassess ROM    Consulted and Agree with Plan of Care Patient           Patient will benefit from skilled therapeutic intervention in order to improve the following  deficits and impairments:  Decreased balance, Decreased endurance, Decreased mobility, Abnormal gait, Hypomobility, Increased muscle spasms, Decreased range of motion, Improper body mechanics, Decreased activity tolerance, Decreased strength, Impaired flexibility, Postural dysfunction, Pain  Visit Diagnosis: Chronic midline low back pain with bilateral sciatica  Muscle weakness (generalized)  Other muscle spasm  Chronic midline low back pain without sciatica     Problem List There are no problems to display for this patient.   Pura Spice, PT, DPT # [id number] Carlyle Basques, SPT 11/28/2019, 8:32 AM  [facility] [address] [place], [place], [address] Phone: [phone]  Fax:  [phone]  Name: Hartleigh Edmonston MRN: [id number] Date of Birth: 10-28-1970

## 2019-11-30 ENCOUNTER — Other Ambulatory Visit: Payer: Self-pay

## 2019-11-30 ENCOUNTER — Encounter: Payer: Self-pay | Admitting: Physical Therapy

## 2019-11-30 ENCOUNTER — Ambulatory Visit: Payer: Medicaid Other | Admitting: Physical Therapy

## 2019-11-30 DIAGNOSIS — M5441 Lumbago with sciatica, right side: Secondary | ICD-10-CM | POA: Diagnosis not present

## 2019-11-30 DIAGNOSIS — M6281 Muscle weakness (generalized): Secondary | ICD-10-CM

## 2019-11-30 DIAGNOSIS — M62838 Other muscle spasm: Secondary | ICD-10-CM

## 2019-11-30 DIAGNOSIS — G8929 Other chronic pain: Secondary | ICD-10-CM

## 2019-11-30 NOTE — Therapy (Signed)
[facility] [address] [place], [place], [address] Phone: [phone]   Fax:  [phone]  Physical Therapy Treatment  Patient Details  Name: Dawn Oconnor MRN: [id number] Date of Birth: 09/28/1970 Referring Provider (PT): Girtha Hake, MD   Encounter Date: 11/30/2019   PT End of Session - 11/30/19 1300    Visit Number 12    Number of Visits 17    Date for PT Re-Evaluation 12/18/19    Authorization Type medicaid    Authorization - Visit Number 2    Authorization - Number of Visits 10    PT Start Time 8938    PT Stop Time 1017    PT Time Calculation (min) 49 min    Activity Tolerance Patient limited by pain;Patient tolerated treatment well    Behavior During Therapy [facility] for tasks assessed/performed           History reviewed. No pertinent past medical history.  History reviewed. No pertinent surgical history.  There were no vitals filed for this visit.   Subjective Assessment - 11/30/19 1256    Subjective Pt. states she feels the pain is worse when she relaxes. Pt. states her pain is 6-7/10 L sided low back, and down R leg to knee.    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 5-10 mins    Diagnostic tests MRI spine, clear    Patient Stated Goals relieve pain    Currently in Pain? Yes    Pain Score 6     Pain Location Back    Pain Orientation Left    Pain Onset More than a month ago           There.ex:  Nu-Step L 2 for 10 min. UE/LE use. Not billed.      Neuro Re-Ed:   Alternating marches on blue exercise ball for core stability: 2x15. Use of mirror for form/technique. Verbal cuing for breathing and elevating feet off of ground. Min tactile cues for upright posture.     4 lbs med ball overhead raise seated on blue exercise ball: x10. Mod verbal and tactile cues for form/technique.    x10 standing lumbar  extensions    Education on standing posture for symptom management with use of mirror for visual cue and core activation during ADL's for reduced LBP.   Use of shoulder shrugs, bicep curls as proprioceptive exercise to educate pt on ability to contract/relax muscles in her body for fractionation of movement to reduce muscular tension causing LBP. Progressed to deep core activation in sitting with tactile cuing to belly.     Posture education at mirror to pt's left and in front of pt. Use of mirrors to educate pt on improved neutral shoulder and spine alignment to reduce upper thoracic pain. Reports of reduced upper thoracic pain.     Diaphragmatic breathing to reduce accessory muscle activation with verbal and tactile cues for belly elevating to ensure proper breathing for pain modulation.     Pain reduced to 5/10 NPS after session with no radicular symptoms.  PT Long Term Goals - 11/20/19 1449      PT LONG TERM GOAL #1   Title Pt. will demonstrate handling at least 15 minutes of sitting with pain less than 4/10 to handle to commute to work.    Baseline 10/4: pt unalbe to sit for more than  5 minute without pain up to 8/10. 11/1: pt able to sit for approx 15 mins with pain no more than 5-7/10    Time 4    Period Weeks    Status Partially Met    Target Date 12/18/19      PT LONG TERM GOAL #2   Title Pt. will be able to lift 20 lbs from the ground to improve ability to complete work related tasks.    Baseline 10/4: pt unalbe to lift 20lbs from ground. 11/1: pt able to lift 7lb box from chair with good technique.    Time 4    Period Weeks    Status Partially Met    Target Date 12/18/19      PT LONG TERM GOAL #3   Title Pt. will improve FOTO score to 60 to improve pain free functional mobility.    Baseline 10/4: 56; 11/1: 59    Time 4    Period Weeks    Status Partially Met    Target Date 12/18/19      PT LONG TERM GOAL #4   Title Pt. will demonstrate WFL spinal flexion to improve  ability to complete ADLs.    Baseline 10/4: pt unable to tie shoes due to inadequate spinal flexion. 11/1: pt still unable to tie shoes due to inadequate spinal flexion. Spinal ext. looks improved.    Time 4    Period Weeks    Status Partially Met    Target Date 12/18/19                 Plan - 11/30/19 1314    Clinical Impression Statement Pt reports to clinic with 6-7/10 NPS. Today's session with focus on seated exercise ball core stability exercises. Radicular symptoms absent after exercises but pt still requiring mod-Max verbal cues for sequencing breathing with exercise and activating core muscles. Significant posture and breathing education to improve motor control and reduce accessory muscle use. Pt's pain reduced to 5/10 NPS with no radicular sx's. Pt can continue to benefit from skilled PT treatment to improve functional mobility.    Personal Factors and Comorbidities Time since onset of injury/illness/exacerbation    Examination-Activity Limitations Bed Mobility;Caring for Others;Carry;Squat;Sit    Examination-Participation Restrictions Cleaning;Community Activity;Laundry    Stability/Clinical Decision Making Evolving/Moderate complexity    Clinical Decision Making Moderate    Rehab Potential Fair    PT Frequency 2x / week    PT Duration 4 weeks    PT Treatment/Interventions ADLs/Self Care Home Management;Aquatic Therapy;Moist Heat;Parrafin;Gait training;Stair training;Functional mobility training;Neuromuscular re-education;Balance training;Therapeutic exercise;Therapeutic activities;Patient/family education;Scar mobilization;Passive range of motion;Manual techniques;Dry needling;Energy conservation;Joint Manipulations;Splinting;Taping;Spinal Manipulations;Cryotherapy    PT Next Visit Plan Continue posture education    Consulted and Agree with Plan of Care Patient           Patient will benefit from skilled therapeutic intervention in order to improve the following deficits  and impairments:  Decreased balance, Decreased endurance, Decreased mobility, Abnormal gait, Hypomobility, Increased muscle spasms, Decreased range of motion, Improper body mechanics, Decreased activity tolerance, Decreased strength, Impaired flexibility, Postural dysfunction, Pain  Visit Diagnosis: Chronic midline low back pain with bilateral sciatica  Muscle weakness (generalized)  Other muscle spasm     Problem List There are no problems to display for this patient.  Pura Spice, PT, DPT # [id number] Carlyle Basques, SPT 11/30/2019, 4:56 PM  [facility] [address] [place], [place], [address] Phone: [phone]   Fax:  [phone]  Name: Dawn Oconnor MRN: [id number] Date of Birth: Aug 24, 1970

## 2019-12-04 ENCOUNTER — Ambulatory Visit: Payer: Medicaid Other | Admitting: Physical Therapy

## 2019-12-06 ENCOUNTER — Encounter: Payer: Self-pay | Admitting: Physical Therapy

## 2019-12-06 ENCOUNTER — Other Ambulatory Visit: Payer: Self-pay

## 2019-12-06 ENCOUNTER — Ambulatory Visit: Payer: Medicaid Other | Admitting: Physical Therapy

## 2019-12-06 DIAGNOSIS — M5441 Lumbago with sciatica, right side: Secondary | ICD-10-CM | POA: Diagnosis not present

## 2019-12-06 DIAGNOSIS — G8929 Other chronic pain: Secondary | ICD-10-CM

## 2019-12-06 DIAGNOSIS — M6281 Muscle weakness (generalized): Secondary | ICD-10-CM

## 2019-12-06 DIAGNOSIS — M62838 Other muscle spasm: Secondary | ICD-10-CM

## 2019-12-06 DIAGNOSIS — M545 Low back pain, unspecified: Secondary | ICD-10-CM

## 2019-12-06 NOTE — Therapy (Signed)
[facility] [address] [place], [place], [address] Phone: [phone]   Fax:  [phone]  Physical Therapy Treatment  Patient Details  Name: Dawn Oconnor MRN: [id number] Date of Birth: 1970-06-02 Referring Provider (PT): Girtha Hake, MD   Encounter Date: 12/06/2019   PT End of Session - 12/06/19 1436    Visit Number 13    Number of Visits 17    Date for PT Re-Evaluation 12/18/19    Authorization Type medicaid    Authorization - Visit Number 3    Authorization - Number of Visits 10    PT Start Time 1430    PT Stop Time 8921    PT Time Calculation (min) 48 min    Activity Tolerance Patient limited by pain;Patient tolerated treatment well    Behavior During Therapy [facility] for tasks assessed/performed           History reviewed. No pertinent past medical history.  History reviewed. No pertinent surgical history.  There were no vitals filed for this visit.   Subjective Assessment - 12/06/19 1437    Subjective Pt. states she is continuing to feel pain all over, 5-6/10 pain currently, was going down R leg earlier.    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 5-10 mins    Diagnostic tests MRI spine, clear    Patient Stated Goals relieve pain    Currently in Pain? Yes    Pain Score 6     Pain Location Back    Pain Orientation Left    Pain Descriptors / Indicators Aching    Pain Onset More than a month ago             There Ex:  NuStep L2 10 mins (unbilled)  Supine TrA activation: pt required significant verbal and tactile cueing to maintain breathing, relax shoulders, and maintain core contraction throughout. Pt unable to complete activity correctly for approx 10x reps, required significant education on why breathing was important. Pt able to complete approx 10-15 reps with correct form without holding tension in  shoulders.   Supine to sit and sit to supine transfers: Pt completed sit to supine and supine to sit transfers, unable to maintain breathing throughout.   Rolling green theraball up wall: 15x, cueing to exhale with ball rolling up, inhale with ball coming down. Pt able to intermittently complete, required significant verbal cueing to complete  Green theraball on wall spinal extension: pt cued 10x10s holds, pt instructed to count out loud to encourage breathing throughout activity.   TrA activation in standing: this caused intense pain in L LE, but resolved pain in R. Pt required significant cueing to decrease tension in full body after pain started. Pt attempted walking to decrease pain.       PT Long Term Goals - 11/20/19 1449      PT LONG TERM GOAL #1   Title Pt. will demonstrate handling at least 15 minutes of sitting with pain less than 4/10 to handle to commute to work.    Baseline 10/4: pt unalbe to sit for more than 5 minute without pain up to 8/10. 11/1: pt able to sit for approx 15 mins with pain no more than 5-7/10    Time 4    Period Weeks    Status Partially Met    Target Date 12/18/19  PT LONG TERM GOAL #2   Title Pt. will be able to lift 20 lbs from the ground to improve ability to complete work related tasks.    Baseline 10/4: pt unalbe to lift 20lbs from ground. 11/1: pt able to lift 7lb box from chair with good technique.    Time 4    Period Weeks    Status Partially Met    Target Date 12/18/19      PT LONG TERM GOAL #3   Title Pt. will improve FOTO score to 60 to improve pain free functional mobility.    Baseline 10/4: 56; 11/1: 59    Time 4    Period Weeks    Status Partially Met    Target Date 12/18/19      PT LONG TERM GOAL #4   Title Pt. will demonstrate WFL spinal flexion to improve ability to complete ADLs.    Baseline 10/4: pt unable to tie shoes due to inadequate spinal flexion. 11/1: pt still unable to tie shoes due to inadequate spinal flexion.  Spinal ext. looks improved.    Time 4    Period Weeks    Status Partially Met    Target Date 12/18/19                 Plan - 12/06/19 1519    Clinical Impression Statement Pt. continues to report widespread pain at the start of each session. Pt. mentions that she feels she is not making much progress in therapy. Pt. continues to demonstrate inability to maintain breathing throughout activities and continues to tense entire body with any movement. Pt. attempted supine TrA activation, felt that it decreased pain however mentioned that it was "work," and wanted to discontinue despite it decreasing her pain. Pt. attempted TrA activation in standing and felt significant increase in pain in L LE despite R LE resolution of sx. A further discussion relating body awareness and relaxation techniques, as well as improving ability to isolate muscle contractions wtihout full body tensing will benefit pt. A further discussion regarding options for pain releif, or potential referral to neruology or rheumatology may be warranted at this point due to consistent reports of widespread diffuse pain with therapy not helping much.    Personal Factors and Comorbidities Time since onset of injury/illness/exacerbation    Examination-Activity Limitations Bed Mobility;Caring for Others;Carry;Squat;Sit    Examination-Participation Restrictions Cleaning;Community Activity;Laundry    Stability/Clinical Decision Making Evolving/Moderate complexity    Clinical Decision Making Moderate    Rehab Potential Fair    PT Frequency 2x / week    PT Duration 4 weeks    PT Treatment/Interventions ADLs/Self Care Home Management;Aquatic Therapy;Moist Heat;Parrafin;Gait training;Stair training;Functional mobility training;Neuromuscular re-education;Balance training;Therapeutic exercise;Therapeutic activities;Patient/family education;Scar mobilization;Passive range of motion;Manual techniques;Dry needling;Energy conservation;Joint  Manipulations;Splinting;Taping;Spinal Manipulations;Cryotherapy    PT Next Visit Plan Continue posture education    Consulted and Agree with Plan of Care Patient           Patient will benefit from skilled therapeutic intervention in order to improve the following deficits and impairments:  Decreased balance, Decreased endurance, Decreased mobility, Abnormal gait, Hypomobility, Increased muscle spasms, Decreased range of motion, Improper body mechanics, Decreased activity tolerance, Decreased strength, Impaired flexibility, Postural dysfunction, Pain  Visit Diagnosis: Chronic midline low back pain with bilateral sciatica  Muscle weakness (generalized)  Other muscle spasm  Chronic midline low back pain without sciatica     Problem List There are no problems to display for this patient.  Cammie Mcgee,  PT, DPT # [id number] Carlyle Basques, SPT 12/07/2019, 9:22 AM  [facility] [address] [place], [place], [address] Phone: [phone]   Fax:  [phone]  Name: Dawn Oconnor MRN: [id number] Date of Birth: 1970/12/26

## 2019-12-11 ENCOUNTER — Ambulatory Visit: Payer: Medicaid Other

## 2019-12-11 ENCOUNTER — Other Ambulatory Visit: Payer: Self-pay

## 2019-12-11 DIAGNOSIS — G8929 Other chronic pain: Secondary | ICD-10-CM

## 2019-12-11 DIAGNOSIS — M5441 Lumbago with sciatica, right side: Secondary | ICD-10-CM | POA: Diagnosis not present

## 2019-12-11 NOTE — Therapy (Signed)
[facility] [address] [place], [place], [address] Phone: [phone]   Fax:  [phone]  Physical Therapy Treatment  Patient Details  Name: Jamiah Homeyer MRN: [id number] Date of Birth: 1970/04/26 Referring Provider (PT): Girtha Hake, MD   Encounter Date: 12/11/2019   PT End of Session - 12/11/19 1534    Visit Number 14    Number of Visits 17    Date for PT Re-Evaluation 12/18/19    Authorization Type medicaid    PT Start Time 8887    PT Stop Time 1600    PT Time Calculation (min) 30 min    Activity Tolerance Patient limited by pain;Patient tolerated treatment well    Behavior During Therapy [facility] for tasks assessed/performed           History reviewed. No pertinent past medical history.  History reviewed. No pertinent surgical history.  There were no vitals filed for this visit.   Subjective Assessment - 12/11/19 1532    Subjective Pt complains of 6/10 low back pain upon arrival. Reports that pain is not radiating down her RLE as much today compared to previous visits. No specific questions or concerns at this time.    Pertinent History Pt has 66 year old daughter, works as English as a second language teacher    Limitations Sitting    How long can you sit comfortably? 5-10 mins    Diagnostic tests MRI spine, clear    Patient Stated Goals relieve pain    Currently in Pain? Yes    Pain Score 6     Pain Location Back    Pain Orientation Right;Left    Pain Descriptors / Indicators Aching    Pain Type Chronic pain    Pain Onset More than a month ago    Pain Frequency Constant             TREATMENT   Ther-ex  NuStep L0-2 x 5 minutes for warm-up during history (3 minutes unbilled) Supine ant/post pelvic tilts 5s hold x 10 each direction;  Hooklying lumbar rotation rocking x 10 toward each direction; Hooklying marches with coordinated exhale for TrA activation x 5 bilateral LE; Hip IR/ER stretches 30s hold each  side; SKTC stretch 30s hold each side; HS stretch 30s hold each side; Facing wall and rolling green theraball up wall 10x, cueing to relax and allow for spinal extension during exercise. Back to wall green theraball spinal extension 3s hold x 10; Discussed prone positioning on chest or elbows at home as pt reports that spinal extension is relieving to pain; Pt provided chapter 1 of "Explain Pain" for reading     Pt educated throughout session about proper posture and technique with exercises. Improved exercise technique, movement at target joints, use of target muscles after min to mod verbal, visual, tactile cues.    Pt arrived late so session is abbreviated today. Performed gentle pain-free AROM of lumbar spine today with hooklying lumbar rocking and pelvic tilts. Abdominal bracing with coordinated exhale to encourage TrA contraction. She reports improvement in symptoms with lumbar extension so pt encouraged to perform prone positioning at home. Provided chapter 1 of "Eplain Pain" for reading and will perform additional therapeutic neuroscience education about chronic pain at next session. Pt will benefit from PT services to address deficits in pain in order to improve symptom-free function at home.  PT Long Term Goals - 11/20/19 1449      PT LONG TERM GOAL #1   Title Pt. will demonstrate handling at least 15 minutes of sitting with pain less than 4/10 to handle to commute to work.    Baseline 10/4: pt unalbe to sit for more than 5 minute without pain up to 8/10. 11/1: pt able to sit for approx 15 mins with pain no more than 5-7/10    Time 4    Period Weeks    Status Partially Met    Target Date 12/18/19      PT LONG TERM GOAL #2   Title Pt. will be able to lift 20 lbs from the ground to improve ability to complete work related tasks.    Baseline 10/4: pt unalbe to lift 20lbs from ground. 11/1: pt able to lift 7lb box from chair with good  technique.    Time 4    Period Weeks    Status Partially Met    Target Date 12/18/19      PT LONG TERM GOAL #3   Title Pt. will improve FOTO score to 60 to improve pain free functional mobility.    Baseline 10/4: 56; 11/1: 59    Time 4    Period Weeks    Status Partially Met    Target Date 12/18/19      PT LONG TERM GOAL #4   Title Pt. will demonstrate WFL spinal flexion to improve ability to complete ADLs.    Baseline 10/4: pt unable to tie shoes due to inadequate spinal flexion. 11/1: pt still unable to tie shoes due to inadequate spinal flexion. Spinal ext. looks improved.    Time 4    Period Weeks    Status Partially Met    Target Date 12/18/19                 Plan - 12/11/19 1534    Personal Factors and Comorbidities Time since onset of injury/illness/exacerbation    Examination-Activity Limitations Bed Mobility;Caring for Others;Carry;Squat;Sit    Examination-Participation Restrictions Cleaning;Community Activity;Laundry    Stability/Clinical Decision Making Evolving/Moderate complexity    Rehab Potential Fair    PT Frequency 2x / week    PT Duration 4 weeks    PT Treatment/Interventions ADLs/Self Care Home Management;Aquatic Therapy;Moist Heat;Parrafin;Gait training;Stair training;Functional mobility training;Neuromuscular re-education;Balance training;Therapeutic exercise;Therapeutic activities;Patient/family education;Scar mobilization;Passive range of motion;Manual techniques;Dry needling;Energy conservation;Joint Manipulations;Splinting;Taping;Spinal Manipulations;Cryotherapy    PT Next Visit Plan Continue posture education    Consulted and Agree with Plan of Care Patient           Patient will benefit from skilled therapeutic intervention in order to improve the following deficits and impairments:  Decreased balance, Decreased endurance, Decreased mobility, Abnormal gait, Hypomobility, Increased muscle spasms, Decreased range of motion, Improper body  mechanics, Decreased activity tolerance, Decreased strength, Impaired flexibility, Postural dysfunction, Pain  Visit Diagnosis: Chronic midline low back pain with bilateral sciatica     Problem List There are no problems to display for this patient.  Lyndel Safe Jadelyn Elks PT, DPT, GCS  Xianna Siverling 12/11/2019, 9:00 PM  [facility] [address] [place], [place], [address] Phone: [phone]   Fax:  [phone]  Name: Shyana Kulakowski MRN: [id number] Date of Birth: [birth year]

## 2019-12-13 ENCOUNTER — Ambulatory Visit: Payer: Medicaid Other

## 2019-12-13 ENCOUNTER — Other Ambulatory Visit: Payer: Self-pay

## 2019-12-13 DIAGNOSIS — M5441 Lumbago with sciatica, right side: Secondary | ICD-10-CM | POA: Diagnosis not present

## 2019-12-13 DIAGNOSIS — G8929 Other chronic pain: Secondary | ICD-10-CM

## 2019-12-13 DIAGNOSIS — M6281 Muscle weakness (generalized): Secondary | ICD-10-CM

## 2019-12-13 NOTE — Therapy (Signed)
[facility] [address] [place], [place], [address] Phone: [phone]   Fax:  [phone]  Physical Therapy Treatment  Patient Details  Name: Dawn Oconnor MRN: [id number] Date of Birth: September 28, 1970 Referring Provider (PT): Girtha Hake, MD   Encounter Date: 12/13/2019   PT End of Session - 12/13/19 1508    Visit Number 15    Number of Visits 17    Date for PT Re-Evaluation 12/18/19    Authorization Type medicaid    PT Start Time 1500    PT Stop Time 1545    PT Time Calculation (min) 45 min    Activity Tolerance Patient limited by pain;Patient tolerated treatment well    Behavior During Therapy [facility] for tasks assessed/performed           History reviewed. No pertinent past medical history.  History reviewed. No pertinent surgical history.  There were no vitals filed for this visit.   Subjective Assessment - 12/13/19 1507    Subjective Pt complains of 5/10 low back pain upon arrival. Reports that pain is not radiating down her RLE currently. No specific questions or concerns at this time.    Pertinent History Pt has 22 year old daughter, works as English as a second language teacher    Limitations Sitting    How long can you sit comfortably? 5-10 mins    Diagnostic tests MRI spine, clear    Patient Stated Goals relieve pain    Currently in Pain? Yes    Pain Score 5     Pain Location Back    Pain Orientation Right;Left    Pain Descriptors / Indicators Aching    Pain Type Chronic pain    Pain Onset More than a month ago    Pain Frequency Constant    Multiple Pain Sites No             TREATMENT   Ther-ex  NuStep L2 x 5 minutes for warm-up during history (3 minutes unbilled) Supine ant/post pelvic tilts 5s hold x 10 each direction;  Hooklying lumbar rotation rocking x 10 toward each direction; Hooklying marches with coordinated exhale for TrA activation x 5 bilateral LE;   Manual Therapy  Hip IR/ER stretches 30s  hold each side; SKTC stretch 30s hold each side; HS stretch 30s hold each side; CPA L1-L5 grade I-II, 20s/bout x 2 bouts/level;   Electrical Stimulation  NMES with patient in prone using large muscle spasm setting on Empi Continuum unit at default setting (pulse rate: 80Hz, off time: 5s, symmetrical waveform, 300 microsec pulse width, 2s ramp up/down, 10s on time/5s off time) at pt tolerable intensity of 2 bilateral, moist heat on low back; Pt provided chapter 2 of "Explain Pain" for reading     Pt educated throughout session about proper posture and technique with exercises. Improved exercise technique, movement at target joints, use of target muscles after min to mod verbal, visual, tactile cues.    Continued with gentle pain-free AROM of lumbar spine today with hooklying lumbar rocking and pelvic tilts. Abdominal bracing with coordinated exhale to encourage TrA contraction. Utilized gentle lumbar mobilizations today for pain modulation as well as electrical stimulation.  Provided chapter 2 of "Eplain Pain" for reading and will perform additional therapeutic neuroscience education about chronic pain at next session. Will continue with pain modulation interventions until pt can tolerate additional strengthening without increase in her pain. Pt will benefit from PT services to address deficits in pain in order to improve  symptom-free function at home.                                 PT Long Term Goals - 11/20/19 1449      PT LONG TERM GOAL #1   Title Pt. will demonstrate handling at least 15 minutes of sitting with pain less than 4/10 to handle to commute to work.    Baseline 10/4: pt unalbe to sit for more than 5 minute without pain up to 8/10. 11/1: pt able to sit for approx 15 mins with pain no more than 5-7/10    Time 4    Period Weeks    Status Partially Met    Target Date 12/18/19      PT LONG TERM GOAL #2   Title Pt. will be able to lift 20 lbs from the  ground to improve ability to complete work related tasks.    Baseline 10/4: pt unalbe to lift 20lbs from ground. 11/1: pt able to lift 7lb box from chair with good technique.    Time 4    Period Weeks    Status Partially Met    Target Date 12/18/19      PT LONG TERM GOAL #3   Title Pt. will improve FOTO score to 60 to improve pain free functional mobility.    Baseline 10/4: 56; 11/1: 59    Time 4    Period Weeks    Status Partially Met    Target Date 12/18/19      PT LONG TERM GOAL #4   Title Pt. will demonstrate WFL spinal flexion to improve ability to complete ADLs.    Baseline 10/4: pt unable to tie shoes due to inadequate spinal flexion. 11/1: pt still unable to tie shoes due to inadequate spinal flexion. Spinal ext. looks improved.    Time 4    Period Weeks    Status Partially Met    Target Date 12/18/19                 Plan - 12/13/19 1510    Clinical Impression Statement Continued with gentle pain-free AROM of lumbar spine today with hooklying lumbar rocking and pelvic tilts. Abdominal bracing with coordinated exhale to encourage TrA contraction. Utilized gentle lumbar mobilizations today for pain modulation as well as electrical stimulation.  Provided chapter 2 of "Eplain Pain" for reading and will perform additional therapeutic neuroscience education about chronic pain at next session. Will continue with pain modulation interventions until pt can tolerate additional strengthening without increase in her pain. Pt will benefit from PT services to address deficits in pain in order to improve symptom-free function at home.    Personal Factors and Comorbidities Time since onset of injury/illness/exacerbation    Examination-Activity Limitations Bed Mobility;Caring for Others;Carry;Squat;Sit    Examination-Participation Restrictions Cleaning;Community Activity;Laundry    Stability/Clinical Decision Making Evolving/Moderate complexity    Rehab Potential Fair    PT Frequency 2x  / week    PT Duration 4 weeks    PT Treatment/Interventions ADLs/Self Care Home Management;Aquatic Therapy;Moist Heat;Parrafin;Gait training;Stair training;Functional mobility training;Neuromuscular re-education;Balance training;Therapeutic exercise;Therapeutic activities;Patient/family education;Scar mobilization;Passive range of motion;Manual techniques;Dry needling;Energy conservation;Joint Manipulations;Splinting;Taping;Spinal Manipulations;Cryotherapy    PT Next Visit Plan Update outcome measures/goals, recertification, continue chronic pain and posture education    Consulted and Agree with Plan of Care Patient           Patient will benefit from skilled therapeutic intervention  in order to improve the following deficits and impairments:  Decreased balance, Decreased endurance, Decreased mobility, Abnormal gait, Hypomobility, Increased muscle spasms, Decreased range of motion, Improper body mechanics, Decreased activity tolerance, Decreased strength, Impaired flexibility, Postural dysfunction, Pain  Visit Diagnosis: Chronic midline low back pain with bilateral sciatica  Muscle weakness (generalized)     Problem List There are no problems to display for this patient.  Lyndel Safe Huprich PT, DPT, GCS  Huprich,Jason 12/13/2019, 4:05 PM  [facility] [address] [place], [place], [address] Phone: [phone]   Fax:  [phone]  Name: Dawn Oconnor MRN: [id number] Date of Birth: Jun 09, 1970

## 2019-12-18 ENCOUNTER — Ambulatory Visit: Payer: Medicaid Other

## 2019-12-20 ENCOUNTER — Other Ambulatory Visit: Payer: Self-pay

## 2019-12-20 ENCOUNTER — Ambulatory Visit: Payer: Medicaid Other | Attending: Physical Medicine & Rehabilitation

## 2019-12-20 DIAGNOSIS — M6281 Muscle weakness (generalized): Secondary | ICD-10-CM | POA: Diagnosis present

## 2019-12-20 DIAGNOSIS — M5442 Lumbago with sciatica, left side: Secondary | ICD-10-CM | POA: Insufficient documentation

## 2019-12-20 DIAGNOSIS — G8929 Other chronic pain: Secondary | ICD-10-CM | POA: Diagnosis present

## 2019-12-20 DIAGNOSIS — M5441 Lumbago with sciatica, right side: Secondary | ICD-10-CM | POA: Insufficient documentation

## 2019-12-20 NOTE — Therapy (Signed)
[facility] [address] [place], [place], [address] Phone: [phone]   Fax:  [phone]  Physical Therapy Treatment/Recertification  Patient Details  Name: Dawn Oconnor MRN: [id number] Date of Birth: 11/20/1970 Referring Provider (PT): Girtha Hake, MD   Encounter Date: 12/20/2019   PT End of Session - 12/20/19 1507    Visit Number 16    Number of Visits 33    Date for PT Re-Evaluation 02/14/20    Authorization Type medicaid    PT Start Time 1510    PT Stop Time 8841    PT Time Calculation (min) 45 min    Activity Tolerance Patient limited by pain;Patient tolerated treatment well    Behavior During Therapy [facility] for tasks assessed/performed           History reviewed. No pertinent past medical history.  History reviewed. No pertinent surgical history.  There were no vitals filed for this visit.   Subjective Assessment - 12/20/19 1505    Subjective Pt complains of 7-8/10 low back pain upon arrival. She had some improvement in her pain after the last therapy session however relief was short and pain returned. No specific questions or concerns at this time.    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 5-10 mins    Diagnostic tests MRI spine, clear    Patient Stated Goals relieve pain    Currently in Pain? Yes    Pain Location Back    Pain Orientation Right;Left;Lower    Pain Descriptors / Indicators Aching    Pain Type Chronic pain    Pain Onset More than a month ago    Pain Frequency Constant              TREATMENT   Ther-ex  NuStep L2 x 5 minutes for warm-up during history (3 minutes unbilled) Goals updated with patient; Supine ant/post pelvic tilts 5s hold x 10 each direction;  Hooklying lumbar rotation rocking x 10 toward each direction; Hooklying marches with coordinated exhale for TrA activation x 5  bilateral LE;   Manual Therapy  Hip IR/ER stretches 30s hold each side; SKTC stretch 30s hold each side; HS stretch 30s hold each side; CPA L1-L5 grade I-II, 20s/bout x 1 bouts/level; During stretching reviewed information from Chapter 2 of "Explain Pain" Pt provided chapter 3 of "Explain Pain" for reading    Electrical Stimulation  NMES with patient in prone using large muscle spasm setting on Empi Continuum unit at default setting (pulse rate: _0 , off time: 5s, symmetrical waveform, 300 microsec pulse width, 2s ramp up/down, 10s on time/5s off time) at pt tolerable intensity of 22 bilateral x 8 minutes;   Pt educated throughout session about proper posture and technique with exercises. Improved exercise technique, movement at target joints, use of target muscles after min to mod verbal, visual, tactile cues.    Patient's back pain is highly irritable today. Updated goals with patient and she is making progress toward her goals however her pain remains highly irritable and continues to limit her function at home and work. Continued with gentle pain-free AROM of lumbar spine today with hooklying lumbar rocking and pelvic tilts. Abdominal bracing with coordinated exhale to encourage TrA contraction. Utilized gentle lumbar mobilizations today for pain modulation as well as electrical stimulation.  Reviewed chapter 2 of "Explain Pain" with patient and provided chapter  3 of for reading. Will perform additional therapeutic neuroscience education about chronic pain at next session. Will continue with pain modulation interventions until pt can tolerate additional strengthening without increase in her pain. She has not yet achieved maximal benefit from physical therapy. Pt will benefit from PT services to address deficits in pain in order to improve symptom-free function at home.                                    PT Long Term Goals - 12/20/19 1508      PT LONG TERM  GOAL #1   Title Pt. will demonstrate handling at least 15 minutes of sitting with pain less than 4/10 to handle to commute to work.    Baseline 10/4: pt unalbe to sit for more than 5 minute without pain up to 51/10; 11/1: pt able to sit for approx 15 mins with pain no more than 5-7/10; 12/20/19: pt able to sit for 87min and pain increases to 5-6/10;    Time 8    Period Weeks    Status Partially Met    Target Date 02/14/20      PT LONG TERM GOAL #2   Title Pt. will be able to lift 20 lbs from the ground to improve ability to complete work related tasks.    Baseline 10/4: pt unalbe to lift 20lbs from ground. 11/1: pt able to lift 7lb box from chair with good technique; 12/20/19: Pt able to lift 20# box from chair height but she gets "pulling" in back;    Time 8    Period Weeks    Status Partially Met    Target Date 02/14/20      PT LONG TERM GOAL #3   Title Pt. will improve FOTO score to 60 to improve pain free functional mobility.    Baseline 10/4: 56; 11/1: 59; 12/20/19: 47    Time 8    Period Weeks    Status On-going    Target Date 02/14/20      PT LONG TERM GOAL #4   Title Pt. will demonstrate WFL spinal flexion to improve ability to complete ADLs.    Baseline 10/4: pt unable to tie shoes due to inadequate spinal flexion. 11/1: pt still unable to tie shoes due to inadequate spinal flexion. Spinal ext. looks improved.; 12/20/19: Pt unable to flex forward in sitting far enough to reach shoes due to increase in pain;    Time 8    Period Weeks    Status Partially Met    Target Date 02/14/20                 Plan - 12/20/19 1507    Clinical Impression Statement Patient's back pain is highly irritable today. Updated goals with patient and she is making progress toward her goals however her pain remains highly irritable and continues to limit her function at home and work. Continued with gentle pain-free AROM of lumbar spine today with hooklying lumbar rocking and pelvic tilts.  Abdominal bracing with coordinated exhale to encourage TrA contraction. Utilized gentle lumbar mobilizations today for pain modulation as well as electrical stimulation.  Reviewed chapter 2 of "Explain Pain" with patient and provided chapter 3 of for reading. Will perform additional therapeutic neuroscience education about chronic pain at next session. Will continue with pain modulation interventions until pt can tolerate additional strengthening without increase in her pain. She  has not yet achieved maximal benefit from physical therapy. Pt will benefit from PT services to address deficits in pain in order to improve symptom-free function at home.    Personal Factors and Comorbidities Time since onset of injury/illness/exacerbation    Examination-Activity Limitations Bed Mobility;Caring for Others;Carry;Squat;Sit    Examination-Participation Restrictions Cleaning;Community Activity;Laundry    Stability/Clinical Decision Making Evolving/Moderate complexity    Rehab Potential Fair    PT Frequency 2x / week    PT Duration 8 weeks    PT Treatment/Interventions ADLs/Self Care Home Management;Aquatic Therapy;Moist Heat;Parrafin;Gait training;Stair training;Functional mobility training;Neuromuscular re-education;Balance training;Therapeutic exercise;Therapeutic activities;Patient/family education;Scar mobilization;Passive range of motion;Manual techniques;Dry needling;Energy conservation;Joint Manipulations;Splinting;Taping;Spinal Manipulations;Cryotherapy    PT Next Visit Plan Update outcome measures/goals, recertification, continue chronic pain and posture education    Consulted and Agree with Plan of Care Patient           Patient will benefit from skilled therapeutic intervention in order to improve the following deficits and impairments:  Decreased balance, Decreased endurance, Decreased mobility, Abnormal gait, Hypomobility, Increased muscle spasms, Decreased range of motion, Improper body mechanics,  Decreased activity tolerance, Decreased strength, Impaired flexibility, Postural dysfunction, Pain  Visit Diagnosis: Chronic midline low back pain with bilateral sciatica  Muscle weakness (generalized)     Problem List There are no problems to display for this patient.  Lyndel Safe Bemnet Trovato PT, DPT, GCS  Meldon Hanzlik 12/21/2019, 8:44 AM  [facility] [address] [place], [place], [address] Phone: [phone]   Fax:  [phone]  Name: Honora Searson MRN: [id number] Date of Birth: [birth year]

## 2019-12-25 ENCOUNTER — Ambulatory Visit: Payer: Medicaid Other

## 2019-12-25 ENCOUNTER — Other Ambulatory Visit: Payer: Self-pay

## 2019-12-25 DIAGNOSIS — M5441 Lumbago with sciatica, right side: Secondary | ICD-10-CM | POA: Diagnosis not present

## 2019-12-25 DIAGNOSIS — M6281 Muscle weakness (generalized): Secondary | ICD-10-CM

## 2019-12-25 DIAGNOSIS — G8929 Other chronic pain: Secondary | ICD-10-CM

## 2019-12-25 NOTE — Therapy (Addendum)
[facility] [address] [place], [place], [address] Phone: [phone]   Fax:  [phone]  Physical Therapy Treatment  Patient Details  Name: Dawn Oconnor MRN: [id number] Date of Birth: October 22, 1970 Referring Provider (PT): Girtha Hake, MD   Encounter Date: 12/25/2019   PT End of Session - 12/25/19 1600    Visit Number 17    Number of Visits 33    Date for PT Re-Evaluation 02/14/20    Authorization Type medicaid    PT Start Time 1601    PT Stop Time 7106    PT Time Calculation (min) 44 min    Activity Tolerance Patient limited by pain;Patient tolerated treatment well    Behavior During Therapy [facility] for tasks assessed/performed           History reviewed. No pertinent past medical history.  History reviewed. No pertinent surgical history.  There were no vitals filed for this visit.   Subjective Assessment - 12/25/19 1600    Subjective Pt complains of 9-10/10 low back pain upon arrival. She had some relief during last therapy session however as soon as she got home she reports her pain returned and was pretty bad. No specific questions or concerns at this time.    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 5-10 mins    Diagnostic tests MRI spine, clear    Patient Stated Goals relieve pain    Currently in Pain? Yes    Pain Score 10-Worst pain ever    Pain Location Back    Pain Orientation Right;Left;Lower    Pain Descriptors / Indicators Aching    Pain Type Chronic pain    Pain Onset More than a month ago    Pain Frequency Constant             TREATMENT   Ther-ex Sci-Fit L4 x 5 minutes for warm-up during history (3 minutes unbilled); Supine ant/post pelvic tilts 5s hold x 10 each direction; Hooklying lumbar rotation rocking x multiple bouts toward each direction throughout exercises to "calm" low  back" Hooklying marches with coordinated exhale for TrA activation x 5 bilateral LE; Hooklying hip fallouts with coordinated exhale for TrA activation x 5 bilateral LE; Hip IR/ER stretches 20s hold each side; SKTC stretch 20s hold each side; HS stretch 20s hold each side;   Electrical Stimulation  NMES with patient in prone using large muscle spasm setting on Empi Continuum unit at default setting (pulse rate: _0 , off time: 5s, symmetrical waveform, 300 microsec pulse width, 2s ramp up/down, 10s on time/5s off time) at pt tolerable intensity of 20 bilateral x 10 minutes with concurrent moist heat pack;   Pt educated throughout session about proper posture and technique with exercises. Improved exercise technique, movement at target joints, use of target muscles after min to mod verbal, visual, tactile cues.   Patient's back pain is highly irritable today and is significantly limiting during session. She requires multiple rest breaks with intermittent lumbar rocking to reset and relax her low back. Continued with gentle pain-free AROM and stabilization of lumbar spine today with pelvic tilts, hooklying marches, and hooking fallouts. Abdominal bracing with coordinated exhale to encourage TrA contraction however pt continues to struggle to coordinate. Pt was unable to finish her reading for today's session so encouraged her to complete before next session. Will perform additional therapeutic neuroscience education  about chronic pain at next session.Will continue with pain modulation interventions until pt can tolerate additional strengthening without increase in her pain. She has not yet achieved maximal benefit from physical therapy. Pt will benefit from PT services to address deficits inpainin order to improve symptom-freefunction at home.                               PT Long Term Goals - 12/20/19 1508      PT LONG TERM GOAL #1   Title Pt. will  demonstrate handling at least 15 minutes of sitting with pain less than 4/10 to handle to commute to work.    Baseline 10/4: pt unalbe to sit for more than 5 minute without pain up to 8/10. 11/1: pt able to sit for approx 15 mins with pain no more than 5-7/10; 12/20/19: pt able to sit for 29mn and pain increases to 5-6/10;    Time 8    Period Weeks    Status Partially Met    Target Date 02/14/20      PT LONG TERM GOAL #2   Title Pt. will be able to lift 20 lbs from the ground to improve ability to complete work related tasks.    Baseline 10/4: pt unalbe to lift 20lbs from ground. 11/1: pt able to lift 7lb box from chair with good technique; 12/20/19: Pt able to lift 20# box from chair height but she gets "pulling" in back;    Time 8    Period Weeks    Status Partially Met    Target Date 02/14/20      PT LONG TERM GOAL #3   Title Pt. will improve FOTO score to 60 to improve pain free functional mobility.    Baseline 10/4: 56; 11/1: 59; 12/20/19: 47    Time 8    Period Weeks    Status On-going    Target Date 02/14/20      PT LONG TERM GOAL #4   Title Pt. will demonstrate WFL spinal flexion to improve ability to complete ADLs.    Baseline 10/4: pt unable to tie shoes due to inadequate spinal flexion. 11/1: pt still unable to tie shoes due to inadequate spinal flexion. Spinal ext. looks improved.; 12/20/19: Pt unable to flex forward in sitting far enough to reach shoes due to increase in pain;    Time 8    Period Weeks    Status Partially Met    Target Date 02/14/20                 Plan - 12/25/19 1600    Clinical Impression Statement Patient's back pain is highly irritable today and is significantly limiting during session. She requires multiple rest breaks with intermittent lumbar rocking to reset and relax her low back. Continued with gentle pain-free AROM and stabilization of lumbar spine today with pelvic tilts, hooklying marches, and hooking fallouts. Abdominal bracing with  coordinated exhale to encourage TrA contraction however pt continues to struggle to coordinate. Pt was unable to finish her reading for today's session so encouraged her to complete before next session. Will perform additional therapeutic neuroscience education about chronic pain at next session. Will continue with pain modulation interventions until pt can tolerate additional strengthening without increase in her pain. She has not yet achieved maximal benefit from physical therapy. Pt will benefit from PT services to address deficits in pain in order to improve symptom-free function  at home.    Personal Factors and Comorbidities Time since onset of injury/illness/exacerbation    Examination-Activity Limitations Bed Mobility;Caring for Others;Carry;Squat;Sit    Examination-Participation Restrictions Cleaning;Community Activity;Laundry    Stability/Clinical Decision Making Evolving/Moderate complexity    Rehab Potential Fair    PT Frequency 2x / week    PT Duration 8 weeks    PT Treatment/Interventions ADLs/Self Care Home Management;Aquatic Therapy;Moist Heat;Parrafin;Gait training;Stair training;Functional mobility training;Neuromuscular re-education;Balance training;Therapeutic exercise;Therapeutic activities;Patient/family education;Scar mobilization;Passive range of motion;Manual techniques;Dry needling;Energy conservation;Joint Manipulations;Splinting;Taping;Spinal Manipulations;Cryotherapy    PT Next Visit Plan Update outcome measures/goals, recertification, continue chronic pain and posture education    Consulted and Agree with Plan of Care Patient           Patient will benefit from skilled therapeutic intervention in order to improve the following deficits and impairments:  Decreased balance, Decreased endurance, Decreased mobility, Abnormal gait, Hypomobility, Increased muscle spasms, Decreased range of motion, Improper body mechanics, Decreased activity tolerance, Decreased strength,  Impaired flexibility, Postural dysfunction, Pain  Visit Diagnosis: Chronic midline low back pain with bilateral sciatica  Muscle weakness (generalized)     Problem List There are no problems to display for this patient.  Lyndel Safe Shanique Aslinger PT, DPT, GCS  Tahj Lindseth 12/26/2019, 8:46 AM  [facility] [address] [place], [place], [address] Phone: [phone]   Fax:  [phone]  Name: Dawn Oconnor MRN: [id number] Date of Birth: 31-Dec-1970

## 2019-12-26 ENCOUNTER — Ambulatory Visit: Payer: Medicaid Other

## 2019-12-28 ENCOUNTER — Ambulatory Visit: Payer: Medicaid Other

## 2019-12-28 NOTE — Patient Instructions (Incomplete)
TREATMENT   Ther-ex Sci-Fit L4 x 5 minutes for warm-up during history (3 minutes unbilled); Supine ant/post pelvic tilts 5s hold x 10 each direction; Hooklying lumbar rotation rocking x multiple bouts toward each direction throughout exercises to "calm" low back" Hooklying marches with coordinated exhale for TrA activation x 5 bilateral LE; Hooklying hip fallouts with coordinated exhale for TrA activation x 5 bilateral LE; Hip IR/ER stretches 20s hold each side; SKTC stretch 20s hold each side; HS stretch 20s hold each side;   Electrical Stimulation NMES with patient in prone using large muscle spasm setting on Empi Continuum unit at default setting (pulse rate: 80Hz , off time: 5s, symmetrical waveform, 300 microsec pulse width, 2s ramp up/down, 10s on time/5s off time) at pt tolerable intensity of 20bilateral x 10 minutes with concurrent moist heat pack;   Pt educated throughout session about proper posture and technique with exercises. Improved exercise technique, movement at target joints, use of target muscles after min to mod verbal, visual, tactile cues.   Patient's back pain is highly irritable today and is significantly limiting during session. She requires multiple rest breaks with intermittent lumbar rocking to reset and relax her low back.Continued with gentle pain-free AROM and stabilization of lumbar spine today with pelvic tilts, hooklying marches, and hooking fallouts. Abdominal bracing with coordinated exhale to encourage TrA contraction however pt continues to struggle to coordinate. Pt was unable to finish her reading for today's session so encouraged her to complete before next session. Will perform additional therapeutic neuroscience education about chronic pain at next session.Will continue with pain modulation interventions until pt can tolerate additional strengthening without increase in her pain.She has not yet achieved maximal benefit from physical  therapy.Pt will benefit from PT services to address deficits inpainin order to improve symptom-freefunction at home.

## 2020-01-02 ENCOUNTER — Other Ambulatory Visit: Payer: Self-pay

## 2020-01-02 ENCOUNTER — Ambulatory Visit: Payer: Medicaid Other

## 2020-01-02 DIAGNOSIS — M5441 Lumbago with sciatica, right side: Secondary | ICD-10-CM | POA: Diagnosis not present

## 2020-01-02 DIAGNOSIS — M5442 Lumbago with sciatica, left side: Secondary | ICD-10-CM

## 2020-01-02 DIAGNOSIS — M6281 Muscle weakness (generalized): Secondary | ICD-10-CM

## 2020-01-02 NOTE — Therapy (Signed)
[place] [facility] [address] [place], [place], [address] Phone: [phone]   Fax:  [phone]  Physical Therapy Treatment  Patient Details  Name: Dawn Oconnor MRN: [id number] Date of Birth: 1970-11-25 Referring Provider (PT): Girtha Hake, MD   Encounter Date: 01/02/2020   PT End of Session - 01/02/20 1556    Visit Number 18    Number of Visits 33    Date for PT Re-Evaluation 02/14/20    Authorization Type medicaid    PT Start Time 1600    PT Stop Time 3762    PT Time Calculation (min) 45 min    Activity Tolerance Patient limited by pain;Patient tolerated treatment well    Behavior During Therapy [facility] for tasks assessed/performed           History reviewed. No pertinent past medical history.  History reviewed. No pertinent surgical history.  There were no vitals filed for this visit.   Subjective Assessment - 01/02/20 1556    Subjective Pt complains of 8/10 low back pain upon arrival. Her back has been aggravated and she wonders if the estim is making it worse. No specific concerns at this time.    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 5-10 mins    Diagnostic tests MRI spine, clear    Patient Stated Goals relieve pain    Currently in Pain? Yes    Pain Score 8     Pain Location Back    Pain Orientation Right;Left;Lower    Pain Descriptors / Indicators Aching    Pain Type Chronic pain    Pain Onset More than a month ago    Pain Frequency Constant            TREATMENT    Ther-ex NuStep L0 x 5 minutes for gentle warm-up during history with moist heat pack on back (4 minutes unbilled); Supine ant/post pelvic tilts 5s hold x 10 each direction; Hooklying lumbar rotation rocking x multiple bouts toward each direction throughout exercises to "calm" low back" Hooklying marches with coordinated exhale for TrA  activation x 5 bilateral LE; Hooklying hip fallouts with coordinated exhale for TrA activation x 5 bilateral LE; Seatedpelvic tilts ant/posterior 3s hold x 10 each direction; Pball forward trunk flexion roll-outs 10s hold x 10; Pball forward trunk flexion roll-outs with right and left bias 10s hold x 10 each direction; Seated marches x 10 BLE, cues for TrA activation; Seated clams with green tband resistance x 10 BLE, cues for TrA activation; Seated adductor ball squeeze x 10 BLE, cues for TrA activation;   Pt educated throughout session about proper posture and technique with exercises. Improved exercise technique, movement at target joints, use of target muscles after min to mod verbal, visual, tactile cues.   Patient's back pain is highly irritable today and is significantly limiting during session but less so than during last session. She requires multiple rest breaks with intermittent lumbar rocking to reset and relax her low back. Utilized moist heat on low back during session for relaxation  Continued with gentle pain-free AROM and stabilization of lumbar spine today with pelvic tilts, hooklying marches, and hooking fallouts. Pt reports less pain in sitting so incorporated more activities in sitting today and that appeared to help. Pt provided section 4 for reading from "Explain Pain." Will perform additional therapeutic neuroscience education about chronic pain  at next session.Will continue with pain modulation interventions until pt can tolerate additional strengthening without increase in her pain. She has not yet achieved maximal benefit from physical therapy. Pt will benefit from PT services to address deficits inpainin order to improve symptom-freefunction at home.                                    PT Long Term Goals - 12/20/19 1508      PT LONG TERM GOAL #1   Title Pt. will demonstrate handling at least 15 minutes of sitting with pain  less than 4/10 to handle to commute to work.    Baseline 10/4: pt unalbe to sit for more than 5 minute without pain up to 8/10. 11/1: pt able to sit for approx 15 mins with pain no more than 5-7/10; 12/20/19: pt able to sit for and pain increases to 5-6/10;    Time 8    Period Weeks    Status Partially Met    Target Date 02/14/20      PT LONG TERM GOAL #2   Title Pt. will be able to lift 20 lbs from the ground to improve ability to complete work related tasks.    Baseline 10/4: pt unalbe to lift 20lbs from ground. 11/1: pt able to lift 7lb box from chair with good technique; 12/20/19: Pt able to lift 20# box from chair height but she gets "pulling" in back;    Time 8    Period Weeks    Status Partially Met    Target Date 02/14/20      PT LONG TERM GOAL #3   Title Pt. will improve FOTO score to 60 to improve pain free functional mobility.    Baseline 10/4: 56; 11/1: 59; 12/20/19: 47    Time 8    Period Weeks    Status On-going    Target Date 02/14/20      PT LONG TERM GOAL #4   Title Pt. will demonstrate WFL spinal flexion to improve ability to complete ADLs.    Baseline 10/4: pt unable to tie shoes due to inadequate spinal flexion. 11/1: pt still unable to tie shoes due to inadequate spinal flexion. Spinal ext. looks improved.; 12/20/19: Pt unable to flex forward in sitting far enough to reach shoes due to increase in pain;    Time 8    Period Weeks    Status Partially Met    Target Date 02/14/20                 Plan - 01/02/20 1556    Clinical Impression Statement Patient's back pain is highly irritable today and is significantly limiting during session but less so than during last session. She requires multiple rest breaks with intermittent lumbar rocking to reset and relax her low back. Utilized moist heat on low back during session for relaxation  Continued with gentle pain-free AROM and stabilization of lumbar spine today with pelvic tilts, hooklying marches, and  hooking fallouts. Pt reports less pain in sitting so incorporated more activities in sitting today and that appeared to help. Pt provided section 4 for reading from "Explain Pain." Will perform additional therapeutic neuroscience education about chronic pain at next session. Will continue with pain modulation interventions until pt can tolerate additional strengthening without increase in her pain. She has not yet achieved maximal benefit from physical therapy. Pt will benefit from  PT services to address deficits in pain in order to improve symptom-free function at home.    Personal Factors and Comorbidities Time since onset of injury/illness/exacerbation    Examination-Activity Limitations Bed Mobility;Caring for Others;Carry;Squat;Sit    Examination-Participation Restrictions Cleaning;Community Activity;Laundry    Stability/Clinical Decision Making Evolving/Moderate complexity    Rehab Potential Fair    PT Frequency 2x / week    PT Duration 8 weeks    PT Treatment/Interventions ADLs/Self Care Home Management;Aquatic Therapy;Moist Heat;Parrafin;Gait training;Stair training;Functional mobility training;Neuromuscular re-education;Balance training;Therapeutic exercise;Therapeutic activities;Patient/family education;Scar mobilization;Passive range of motion;Manual techniques;Dry needling;Energy conservation;Joint Manipulations;Splinting;Taping;Spinal Manipulations;Cryotherapy    PT Next Visit Plan continue chronic pain and posture education    Consulted and Agree with Plan of Care Patient           Patient will benefit from skilled therapeutic intervention in order to improve the following deficits and impairments:  Decreased balance,Decreased endurance,Decreased mobility,Abnormal gait,Hypomobility,Increased muscle spasms,Decreased range of motion,Improper body mechanics,Decreased activity tolerance,Decreased strength,Impaired flexibility,Postural dysfunction,Pain  Visit Diagnosis: Chronic midline  low back pain with bilateral sciatica  Muscle weakness (generalized)     Problem List There are no problems to display for this patient.  Lyndel Safe Avilyn Virtue PT, DPT, GCS  Beverlie Kurihara 01/03/2020, 1:15 PM  [facility] [address] [place], [place], [address] Phone: [phone]   Fax:  [phone]  Name: Maygen Sirico MRN: [id number] Date of Birth: February 19, 1970

## 2020-01-04 ENCOUNTER — Other Ambulatory Visit: Payer: Self-pay

## 2020-01-04 ENCOUNTER — Ambulatory Visit: Payer: Medicaid Other

## 2020-01-04 DIAGNOSIS — M6281 Muscle weakness (generalized): Secondary | ICD-10-CM

## 2020-01-04 DIAGNOSIS — G8929 Other chronic pain: Secondary | ICD-10-CM

## 2020-01-04 DIAGNOSIS — M5441 Lumbago with sciatica, right side: Secondary | ICD-10-CM | POA: Diagnosis not present

## 2020-01-04 NOTE — Therapy (Signed)
[facility] [address] [place], [place], [address] Phone: [phone]   Fax:  [phone]  Physical Therapy Treatment  Patient Details  Name: Dawn Oconnor MRN: [id number] Date of Birth: February 02, 1970 Referring Provider (PT): Girtha Hake, MD   Encounter Date: 01/04/2020   PT End of Session - 01/04/20 1708    Visit Number 19    Number of Visits 33    Date for PT Re-Evaluation 02/14/20    Authorization Type medicaid    PT Start Time 0160    PT Stop Time 1730    PT Time Calculation (min) 40 min    Activity Tolerance Patient limited by pain;Patient tolerated treatment well    Behavior During Therapy [facility] for tasks assessed/performed           History reviewed. No pertinent past medical history.  History reviewed. No pertinent surgical history.  There were no vitals filed for this visit.   Subjective Assessment - 01/04/20 1707    Subjective Pt complains of 7/10 low back pain upon arrival. No specific concerns at this time.    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 5-10 mins    Diagnostic tests MRI spine, clear    Patient Stated Goals relieve pain    Currently in Pain? Yes    Pain Score 7     Pain Location Back    Pain Orientation Right;Left;Lower    Pain Descriptors / Indicators Aching    Pain Type Chronic pain    Pain Onset More than a month ago    Pain Frequency Constant              TREATMENT    Ther-ex NuStep L1-3 x 5 minutes for gentle warm-up during history with moist heat pack on back (2 minutes unbilled); Seated ant/post pelvic tilts 5s hold x 10 each direction; Seated lateral pelvic tilts 5s hold x 10 each direction; Pball forward trunk flexion roll-outs 5s hold x 10; Pball forward trunk flexion roll-outs with right and left bias 5s hold x 10 each direction; Seated marches x 10 BLE, cues for TrA  activation; Seated thoracic rotation stretch 30s hold x 2 bilateral; Seated LAQ 5s hold x 10 BLE; Seated HS stretch x 30s BLE; Seated hip IR and ER stretches x 30s each bilateral, pt unable to perform L hip ER stretch as she reports too much pain; Pt take through progressive muscle relaxation exercise in sitting;   Pt educated throughout session about proper posture and technique with exercises. Improved exercise technique, movement at target joints, use of target muscles after min to mod verbal, visual, tactile cues.   Patient's back pain remains high and irritably however less than during previous sessions. Performed most of session in sitting today to minimize low back discomfort. Incorporated additional stretches today in sitting however pt does struggle to perform hip IR/ER stretches in sitting. Pt taken through progressive muscle relaxation exercise in sitting today at end of session to encourage diaphragmatic breathing and relaxation of low back musculature. Will perform additional therapeutic neuroscience education about chronic pain at next session.Will continue with pain modulation interventions until pt can tolerate additional strengthening without increase in her pain. She will need a progress note at next session.She has not yet achieved maximal benefit from physical therapy.Pt will benefit from PT services to address deficits inpainin order to improve  symptom-freefunction at home.                             PT Long Term Goals - 12/20/19 1508      PT LONG TERM GOAL #1   Title Pt. will demonstrate handling at least 15 minutes of sitting with pain less than 4/10 to handle to commute to work.    Baseline 10/4: pt unalbe to sit for more than 5 minute without pain up to 8/10. 11/1: pt able to sit for approx 15 mins with pain no more than 5-7/10; 12/20/19: pt able to sit for 36mn and pain increases to 5-6/10;    Time 8    Period Weeks    Status  Partially Met    Target Date 02/14/20      PT LONG TERM GOAL #2   Title Pt. will be able to lift 20 lbs from the ground to improve ability to complete work related tasks.    Baseline 10/4: pt unalbe to lift 20lbs from ground. 11/1: pt able to lift 7lb box from chair with good technique; 12/20/19: Pt able to lift 20# box from chair height but she gets "pulling" in back;    Time 8    Period Weeks    Status Partially Met    Target Date 02/14/20      PT LONG TERM GOAL #3   Title Pt. will improve FOTO score to 60 to improve pain free functional mobility.    Baseline 10/4: 56; 11/1: 59; 12/20/19: 47    Time 8    Period Weeks    Status On-going    Target Date 02/14/20      PT LONG TERM GOAL #4   Title Pt. will demonstrate WFL spinal flexion to improve ability to complete ADLs.    Baseline 10/4: pt unable to tie shoes due to inadequate spinal flexion. 11/1: pt still unable to tie shoes due to inadequate spinal flexion. Spinal ext. looks improved.; 12/20/19: Pt unable to flex forward in sitting far enough to reach shoes due to increase in pain;    Time 8    Period Weeks    Status Partially Met    Target Date 02/14/20                 Plan - 01/04/20 1708    Clinical Impression Statement Patient's back pain remains high and irritably however less than during previous sessions. Performed most of session in sitting today to minimize low back discomfort. Incorporated additional stretches today in sitting however pt does struggle to perform hip IR/ER stretches in sitting. Pt taken through progressive muscle relaxation exercise in sitting today at end of session to encourage diaphragmatic breathing and relaxation of low back musculature. Will perform additional therapeutic neuroscience education about chronic pain at next session. Will continue with pain modulation interventions until pt can tolerate additional strengthening without increase in her pain. She will need a progress note at next  session. She has not yet achieved maximal benefit from physical therapy. Pt will benefit from PT services to address deficits in pain in order to improve symptom-free function at home.    Personal Factors and Comorbidities Time since onset of injury/illness/exacerbation    Examination-Activity Limitations Bed Mobility;Caring for Others;Carry;Squat;Sit    Examination-Participation Restrictions Cleaning;Community Activity;Laundry    Stability/Clinical Decision Making Evolving/Moderate complexity    Rehab Potential Fair    PT Frequency 2x / week  PT Duration 8 weeks    PT Treatment/Interventions ADLs/Self Care Home Management;Aquatic Therapy;Moist Heat;Parrafin;Gait training;Stair training;Functional mobility training;Neuromuscular re-education;Balance training;Therapeutic exercise;Therapeutic activities;Patient/family education;Scar mobilization;Passive range of motion;Manual techniques;Dry needling;Energy conservation;Joint Manipulations;Splinting;Taping;Spinal Manipulations;Cryotherapy    PT Next Visit Plan Progress note; continue chronic pain and posture education    Consulted and Agree with Plan of Care Patient           Patient will benefit from skilled therapeutic intervention in order to improve the following deficits and impairments:  Decreased balance,Decreased endurance,Decreased mobility,Abnormal gait,Hypomobility,Increased muscle spasms,Decreased range of motion,Improper body mechanics,Decreased activity tolerance,Decreased strength,Impaired flexibility,Postural dysfunction,Pain  Visit Diagnosis: Chronic midline low back pain with bilateral sciatica  Muscle weakness (generalized)     Problem List There are no problems to display for this patient.  Lyndel Safe Shirl Ludington PT, DPT, GCS  Scott Vanderveer 01/05/2020, 3:09 PM  [place] [facility] [address] [place], [place], [address] Phone: [phone]   Fax:  [phone]  Name: Dorthy Magnussen MRN: [id number] Date of Birth: April 26, 1970

## 2020-01-11 ENCOUNTER — Ambulatory Visit: Payer: Medicaid Other

## 2020-01-11 ENCOUNTER — Other Ambulatory Visit: Payer: Self-pay

## 2020-01-11 DIAGNOSIS — M6281 Muscle weakness (generalized): Secondary | ICD-10-CM

## 2020-01-11 DIAGNOSIS — G8929 Other chronic pain: Secondary | ICD-10-CM

## 2020-01-11 DIAGNOSIS — M5441 Lumbago with sciatica, right side: Secondary | ICD-10-CM | POA: Diagnosis not present

## 2020-01-11 NOTE — Therapy (Addendum)
[place] [facility] [address] [place], [place], [address] Phone: [phone]   Fax:  [phone]  Physical Therapy Progress Note  Dates of reporting period  11/22/19   to   01/11/20  Patient Details  Name: Dawn Oconnor MRN: [id number] Date of Birth: 1970-06-23 Referring Provider (PT): Girtha Hake, MD   Encounter Date: 01/11/2020   PT End of Session - 01/11/20 1442    Visit Number 20    Number of Visits 33    Date for PT Re-Evaluation 02/14/20    Authorization Type medicaid    PT Start Time 6962    PT Stop Time 9528    PT Time Calculation (min) 43 min    Activity Tolerance Patient limited by pain;Patient tolerated treatment well    Behavior During Therapy [facility] for tasks assessed/performed           History reviewed. No pertinent past medical history.  History reviewed. No pertinent surgical history.  There were no vitals filed for this visit.   Subjective Assessment - 01/11/20 1441    Subjective Pt complains of 5/10 low back pain upon arrival.  She complains of some right upper trap soreness upon waking today and thinks she might of slept in an odd position.  No specific concerns at this time.    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 5-10 mins    Diagnostic tests MRI spine, clear    Patient Stated Goals relieve pain    Currently in Pain? Yes    Pain Score 5     Pain Location Back    Pain Orientation Right;Left;Lower    Pain Descriptors / Indicators Aching    Pain Type Chronic pain    Pain Onset More than a month ago    Pain Frequency Constant                  TREATMENT   Ther-ex NuStep L2x 5 minutes for gentlewarm-up during historywith moist heat pack on back(33minutes unbilled); Seated marches x 10 BLE, cues for TrA activation; Seated ant/post pelvic tilts 5s hold x 10 each direction; Seated  lateral pelvic tilts 5s hold x 10 each direction; Pball forward trunk flexion roll-outs 5s hold x 10; Pball forward trunk flexion roll-outs with right and left bias 5s hold x 10 each direction; Seated thoracic rotation stretch 30s hold x 2 bilateral; Seated sciatic nerve glides x multiple bouts on each side; Seated HS stretch x 30s BLE; Seated hip IR and ER stretches x 30s each bilateral, pt requires assist for LLE; Brief progressive muscle relaxation activity with patient from head to feet with diaphragmatic breathing;   Pt educated throughout session about proper posture and technique with exercises. Improved exercise technique, movement at target joints, use of target muscles after min to mod verbal, visual, tactile cues.   Recently updated goals with patient and she is making progress toward her goals however her pain remains highly irritable and continues to limit her function at home and work.   Therapy has progressed to seated position as it appears more comfortable for patient and less irritable to her back pain.  Continued with gentle pain-free AROM of lumbar spine today with seated lumbar rocking and pelvic tilts.  Hip stretches are easier today in sitting but remain challenging.  Abdominal bracing with coordinated exhale to encourage TrA contraction utilized  during seated marches.  Patient provided chapter 4 of "Explain Pain" for reading. Will perform additional therapeutic neuroscience education about chronic pain at next session.Will continue with pain modulation interventions until pt can tolerate additional strengthening without increase in her pain. She has not yet achieved maximal benefit from physical therapy. Pt will benefit from PT services to address deficits inpainin order to improve symptom-freefunction at home.                          PT Long Term Goals - 01/11/20 1634      PT LONG TERM GOAL #1   Title Pt. will demonstrate handling at least  15 minutes of sitting with pain less than 4/10 to handle to commute to work.    Baseline 10/4: pt unalbe to sit for more than 5 minute without pain up to 8/10. 11/1: pt able to sit for approx 15 mins with pain no more than 5-7/10; 12/20/19: pt able to sit for 31mn and pain increases to 5-6/10;    Time 8    Period Weeks    Status Partially Met    Target Date 02/14/20      PT LONG TERM GOAL #2   Title Pt. will be able to lift 20 lbs from the ground to improve ability to complete work related tasks.    Baseline 10/4: pt unalbe to lift 20lbs from ground. 11/1: pt able to lift 7lb box from chair with good technique; 12/20/19: Pt able to lift 20# box from chair height but she gets "pulling" in back;    Time 8    Period Weeks    Status Partially Met    Target Date 02/14/20      PT LONG TERM GOAL #3   Title Pt. will improve FOTO score to 60 to improve pain free functional mobility.    Baseline 10/4: 56; 11/1: 59; 12/20/19: 47    Time 8    Period Weeks    Status On-going    Target Date 02/14/20      PT LONG TERM GOAL #4   Title Pt. will demonstrate WFL spinal flexion to improve ability to complete ADLs.    Baseline 10/4: pt unable to tie shoes due to inadequate spinal flexion. 11/1: pt still unable to tie shoes due to inadequate spinal flexion. Spinal ext. looks improved.; 12/20/19: Pt unable to flex forward in sitting far enough to reach shoes due to increase in pain;    Time 8    Period Weeks    Status Partially Met    Target Date 02/14/20                 Plan - 01/11/20 1442    Clinical Impression Statement Recently updated goals with patient and she is making progress toward her goals however her pain remains highly irritable and continues to limit her function at home and work.   Therapy has progressed to seated position as it appears more comfortable for patient and less irritable to her back pain.  Continued with gentle pain-free AROM of lumbar spine today with seated lumbar  rocking and pelvic tilts.  Hip stretches are easier today in sitting but remain challenging.  Abdominal bracing with coordinated exhale to encourage TrA contraction utilized during seated marches.  Patient provided chapter 4 of "Explain Pain" for reading. Will perform additional therapeutic neuroscience education about chronic pain at next session. Will continue with pain modulation interventions until pt can  tolerate additional strengthening without increase in her pain. She has not yet achieved maximal benefit from physical therapy. Pt will benefit from PT services to address deficits in pain in order to improve symptom-free function at home.    Personal Factors and Comorbidities Time since onset of injury/illness/exacerbation    Examination-Activity Limitations Bed Mobility;Caring for Others;Carry;Squat;Sit    Examination-Participation Restrictions Cleaning;Community Activity;Laundry    Stability/Clinical Decision Making Evolving/Moderate complexity    Rehab Potential Fair    PT Frequency 2x / week    PT Duration 8 weeks    PT Treatment/Interventions ADLs/Self Care Home Management;Aquatic Therapy;Moist Heat;Parrafin;Gait training;Stair training;Functional mobility training;Neuromuscular re-education;Balance training;Therapeutic exercise;Therapeutic activities;Patient/family education;Scar mobilization;Passive range of motion;Manual techniques;Dry needling;Energy conservation;Joint Manipulations;Splinting;Taping;Spinal Manipulations;Cryotherapy    PT Next Visit Plan Continue chronic pain and posture education    Consulted and Agree with Plan of Care Patient           Patient will benefit from skilled therapeutic intervention in order to improve the following deficits and impairments:  Decreased balance,Decreased endurance,Decreased mobility,Abnormal gait,Hypomobility,Increased muscle spasms,Decreased range of motion,Improper body mechanics,Decreased activity tolerance,Decreased strength,Impaired  flexibility,Postural dysfunction,Pain  Visit Diagnosis: Chronic midline low back pain with bilateral sciatica  Muscle weakness (generalized)     Problem List There are no problems to display for this patient.  Lyndel Safe Arisbeth Purrington PT, DPT, GCS  Maralee Higuchi 01/11/2020, 4:34 PM  [place] [facility] [address] [place], [place], [address] Phone: [phone]   Fax:  [phone]  Name: Dawn Oconnor MRN: [id number] Date of Birth: 1970/05/11

## 2020-01-16 ENCOUNTER — Ambulatory Visit: Payer: Medicaid Other

## 2020-01-16 ENCOUNTER — Other Ambulatory Visit: Payer: Self-pay

## 2020-01-16 DIAGNOSIS — M6281 Muscle weakness (generalized): Secondary | ICD-10-CM

## 2020-01-16 DIAGNOSIS — G8929 Other chronic pain: Secondary | ICD-10-CM

## 2020-01-16 DIAGNOSIS — M5441 Lumbago with sciatica, right side: Secondary | ICD-10-CM | POA: Diagnosis not present

## 2020-01-16 NOTE — Therapy (Signed)
[place] [facility] [address] [place], [place], [address] Phone: [phone]   Fax:  [phone]  Physical Therapy Treatment  Patient Details  Name: Dawn Oconnor MRN: [id number] Date of Birth: 11-20-1970 Referring Provider (PT): Girtha Hake, MD   Encounter Date: 01/16/2020   PT End of Session - 01/16/20 1445    Visit Number 21    Number of Visits 33    Date for PT Re-Evaluation 02/14/20    Authorization Type medicaid    PT Start Time 9030    PT Stop Time 1515    PT Time Calculation (min) 32 min    Activity Tolerance Patient limited by pain;Patient tolerated treatment well    Behavior During Therapy [facility] for tasks assessed/performed           History reviewed. No pertinent past medical history.  History reviewed. No pertinent surgical history.  There were no vitals filed for this visit.   Subjective Assessment - 01/16/20 1444    Subjective Pt complains of 6/10 low back pain upon arrival. She was able to get out and walk over the weekend and reports that it is helpful for her pain. She reports her neck pain has improved since last therapy session.  No specific concerns at this time.    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 5-10 mins    Diagnostic tests MRI spine, clear    Patient Stated Goals relieve pain    Currently in Pain? Yes    Pain Score 6     Pain Location Back    Pain Orientation Right;Left;Lower    Pain Descriptors / Indicators Aching    Pain Type Chronic pain    Pain Onset More than a month ago             TREATMENT   Ther-ex Seated marches x 20 BLE, cues for TrA activation; Seated LAQ x 20 BLE; Seated adductor isometric ball squeeze 3s hold x 15; Seated clams with green tband 3s hold x 15; Seatedant/post pelvic tilts 5s hold x 10 each direction; Seated lateral pelvic tilts 5s hold x 10 each  direction; Seated thoracic rotation stretch 30s hold x 2 bilateral; Seated sciatic nerve glides x 10 on each side; Seated HS stretch 2 x 30s BLE; Pball forward trunk flexion roll-outs5s hold x 10; Pball forward trunk flexion roll-outs with right and left bias5s hold x 10 each direction; Seated hip IR and ER stretches x 30s each bilateral, pt requires assist for LLE;   Pt educated throughout session about proper posture and technique with exercises. Improved exercise technique, movement at target joints, use of target muscles after min to mod verbal, visual, tactile cues.   Pt arrived late so session was somewhat abbreviated today. Continued with gentle pain-free AROM of lumbar spine today with seated lumbar rocking and pelvic tilts. Also continued with hip stretches.  Abdominal bracing with coordinated exhale to encourage TrA contraction utilized during seated marches. Overall pt appears to be improving with respect to her back pain and independent management.Will continue with pain modulation interventions until pt can tolerate additional strengthening without increase in her pain.She has not yet achieved maximal benefit from physical therapy.Pt will benefit from PT services to address deficits inpainin order to improve symptom-freefunction at home.  PT Long Term Goals - 01/11/20 1634      PT LONG TERM GOAL #1   Title Pt. will demonstrate handling at least 15 minutes of sitting with pain less than 4/10 to handle to commute to work.    Baseline 10/4: pt unalbe to sit for more than 5 minute without pain up to 8/10. 11/1: pt able to sit for approx 15 mins with pain no more than 5-7/10; 12/20/19: pt able to sit for 7mn and pain increases to 5-6/10;    Time 8    Period Weeks    Status Partially Met    Target Date 02/14/20      PT LONG TERM GOAL #2   Title Pt. will be able to lift 20 lbs from the ground to improve ability  to complete work related tasks.    Baseline 10/4: pt unalbe to lift 20lbs from ground. 11/1: pt able to lift 7lb box from chair with good technique; 12/20/19: Pt able to lift 20# box from chair height but she gets "pulling" in back;    Time 8    Period Weeks    Status Partially Met    Target Date 02/14/20      PT LONG TERM GOAL #3   Title Pt. will improve FOTO score to 60 to improve pain free functional mobility.    Baseline 10/4: 56; 11/1: 59; 12/20/19: 47    Time 8    Period Weeks    Status On-going    Target Date 02/14/20      PT LONG TERM GOAL #4   Title Pt. will demonstrate WFL spinal flexion to improve ability to complete ADLs.    Baseline 10/4: pt unable to tie shoes due to inadequate spinal flexion. 11/1: pt still unable to tie shoes due to inadequate spinal flexion. Spinal ext. looks improved.; 12/20/19: Pt unable to flex forward in sitting far enough to reach shoes due to increase in pain;    Time 8    Period Weeks    Status Partially Met    Target Date 02/14/20                 Plan - 01/16/20 1446    Clinical Impression Statement Pt arrived late so session was somewhat abbreviated today. Continued with gentle pain-free AROM of lumbar spine today with seated lumbar rocking and pelvic tilts. Also continued with hip stretches.  Abdominal bracing with coordinated exhale to encourage TrA contraction utilized during seated marches. Overall pt appears to be improving with respect to her back pain and independent management. Will continue with pain modulation interventions until pt can tolerate additional strengthening without increase in her pain. She has not yet achieved maximal benefit from physical therapy. Pt will benefit from PT services to address deficits in pain in order to improve symptom-free function at home.    Personal Factors and Comorbidities Time since onset of injury/illness/exacerbation    Examination-Activity Limitations Bed Mobility;Caring for  Others;Carry;Squat;Sit    Examination-Participation Restrictions Cleaning;Community Activity;Laundry    Stability/Clinical Decision Making Evolving/Moderate complexity    Rehab Potential Fair    PT Frequency 2x / week    PT Duration 8 weeks    PT Treatment/Interventions ADLs/Self Care Home Management;Aquatic Therapy;Moist Heat;Parrafin;Gait training;Stair training;Functional mobility training;Neuromuscular re-education;Balance training;Therapeutic exercise;Therapeutic activities;Patient/family education;Scar mobilization;Passive range of motion;Manual techniques;Dry needling;Energy conservation;Joint Manipulations;Splinting;Taping;Spinal Manipulations;Cryotherapy    PT Next Visit Plan Continue chronic pain and posture education, hip, low back, and abdominal strengthening    Consulted and Agree  with Plan of Care Patient           Patient will benefit from skilled therapeutic intervention in order to improve the following deficits and impairments:  Decreased balance,Decreased endurance,Decreased mobility,Abnormal gait,Hypomobility,Increased muscle spasms,Decreased range of motion,Improper body mechanics,Decreased activity tolerance,Decreased strength,Impaired flexibility,Postural dysfunction,Pain  Visit Diagnosis: Chronic midline low back pain with bilateral sciatica  Muscle weakness (generalized)     Problem List There are no problems to display for this patient.  Lyndel Safe Lynton Crescenzo PT, DPT, GCS  Priscilla Finklea 01/16/2020, 3:30 PM  [place] [facility] [address] [place], [place], [address] Phone: [phone]   Fax:  [phone]  Name: Bryahna Lesko MRN: [id number] Date of Birth: [birth year]

## 2020-01-18 ENCOUNTER — Ambulatory Visit: Payer: Medicaid Other

## 2020-01-22 NOTE — Patient Instructions (Incomplete)
TREATMENT   Ther-ex Seated marches x 20 BLE, cues for TrA activation; Seated LAQ x 20 BLE; Seated adductor isometric ball squeeze 3s hold x 15; Seated clams with green tband 3s hold x 15; Seatedant/post pelvic tilts 5s hold x 10 each direction; Seated lateral pelvic tilts 5s hold x 10 each direction; Seated thoracic rotation stretch 30s hold x 2 bilateral; Seatedsciatic nerve glides x 10 on each side; Seated HS stretch 2 x 30s BLE; Pball forward trunk flexion roll-outs5s hold x 10; Pball forward trunk flexion roll-outs with right and left bias5s hold x 10 each direction; Seated hip IR and ER stretches x 30s each bilateral,pt requires assist for LLE;   Pt educated throughout session about proper posture and technique with exercises. Improved exercise technique, movement at target joints, use of target muscles after min to mod verbal, visual, tactile cues.   Pt arrived late so session was somewhat abbreviated today.Continued with gentle pain-free AROM of lumbar spine today withseatedlumbar rocking and pelvic tilts.Also continued with hip stretches. Abdominal bracing with coordinated exhale to encourage TrA contractionutilized during seated marches. Overall pt appears to be improving with respect to her back pain and independent management.Will continue with pain modulation interventions until pt can tolerate additional strengthening without increase in her pain.She has not yet achieved maximal benefit from physical therapy.Pt will benefit from PT services to address deficits inpainin order to improve symptom-freefunction at home.

## 2020-01-23 ENCOUNTER — Ambulatory Visit: Payer: Medicaid Other | Attending: Physical Medicine & Rehabilitation

## 2020-01-23 DIAGNOSIS — M6281 Muscle weakness (generalized): Secondary | ICD-10-CM | POA: Insufficient documentation

## 2020-01-23 DIAGNOSIS — M5442 Lumbago with sciatica, left side: Secondary | ICD-10-CM | POA: Insufficient documentation

## 2020-01-23 DIAGNOSIS — G8929 Other chronic pain: Secondary | ICD-10-CM | POA: Insufficient documentation

## 2020-01-23 DIAGNOSIS — M62838 Other muscle spasm: Secondary | ICD-10-CM | POA: Insufficient documentation

## 2020-01-23 DIAGNOSIS — M5441 Lumbago with sciatica, right side: Secondary | ICD-10-CM | POA: Insufficient documentation

## 2020-01-25 ENCOUNTER — Ambulatory Visit: Payer: Medicaid Other

## 2020-01-25 ENCOUNTER — Other Ambulatory Visit: Payer: Self-pay

## 2020-01-25 DIAGNOSIS — M6281 Muscle weakness (generalized): Secondary | ICD-10-CM

## 2020-01-25 DIAGNOSIS — M5442 Lumbago with sciatica, left side: Secondary | ICD-10-CM

## 2020-01-25 DIAGNOSIS — G8929 Other chronic pain: Secondary | ICD-10-CM | POA: Diagnosis present

## 2020-01-25 DIAGNOSIS — M62838 Other muscle spasm: Secondary | ICD-10-CM | POA: Diagnosis present

## 2020-01-25 DIAGNOSIS — M5441 Lumbago with sciatica, right side: Secondary | ICD-10-CM | POA: Diagnosis present

## 2020-01-25 NOTE — Therapy (Signed)
[facility] [address]. [place], [place], [address] Phone: [phone]   Fax:  [phone]  Physical Therapy Treatment  Patient Details  Name: Khalani Novoa MRN: [id number] Date of Birth: 06-18-1970 Referring Provider (PT): Girtha Hake, MD   Encounter Date: 01/25/2020   PT End of Session - 01/25/20 1436    Visit Number 22    Number of Visits 33    Date for PT Re-Evaluation 02/14/20    Authorization Type medicaid    PT Start Time 0626    PT Stop Time 9485    PT Time Calculation (min) 38 min    Activity Tolerance Patient limited by pain;Patient tolerated treatment well    Behavior During Therapy [facility] for tasks assessed/performed           History reviewed. No pertinent past medical history.  History reviewed. No pertinent surgical history.  There were no vitals filed for this visit.   Subjective Assessment - 01/25/20 1436    Subjective Pt complains of 6/10 low back pain upon arrival. Walking a little but her left leg has been "acting crazy." No specific concerns at this time.    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 5-10 mins    Diagnostic tests MRI spine, clear    Patient Stated Goals relieve pain    Currently in Pain? Yes    Pain Score 6     Pain Location Back    Pain Orientation Right;Left;Lower    Pain Descriptors / Indicators Aching    Pain Type Chronic pain    Pain Onset More than a month ago    Pain Frequency Constant                TREATMENT   Ther-ex Seated marches x 20 BLE, cues for TrA activation; Seated adductor isometric ball squeeze 3s hold x 15; Seated clams with green tband 3s hold x 15; Seatedant/post pelvic tilts 5s hold x 10 each direction; Seated lateral pelvic tilts 5s hold x 10 each direction; Seated thoracic rotation stretch 30s hold x 2 bilateral; Seated HS stretch 2 x 30s  BLE; Pball forward trunk flexion roll-outs5s hold x 10; Pball forward trunk flexion roll-outs with right and left bias5s hold x 10 each direction; Seated hip IR and ER stretches x 30s each bilateral, pt requires assist for LLE; Sit to stand with hands on knees x 5; Seated LAQ with 2# ankle weigths (AW); Standing exercises with 2# AW: Marches x 10 BLE; Hip abduction x 10 BLE; Hip extension x 10 BLE; HS curls x 10 BLE;   Pt educated throughout session about proper posture and technique with exercises. Improved exercise technique, movement at target joints, use of target muscles after min to mod verbal, visual, tactile cues.   Continued with gentle pain-free AROM of lumbar spine today with seated lumbar rocking and pelvic tilts. Also continued with hip stretches. Progressed pt to additional standing exercises today with ankle weights. Overall pt appears to be improving with respect to her back pain and independent management.Will continue with pain modulation interventions until pt can tolerate additional strengthening without increase in her pain.She has not yet achieved maximal benefit from physical therapy.Pt will benefit from PT services to address deficits inpainin order to improve symptom-freefunction at home.  PT Long Term Goals - 01/11/20 1634      PT LONG TERM GOAL #1   Title Pt. will demonstrate handling at least 15 minutes of sitting with pain less than 4/10 to handle to commute to work.    Baseline 10/4: pt unalbe to sit for more than 5 minute without pain up to 8/10. 11/1: pt able to sit for approx 15 mins with pain no more than 5-7/10; 12/20/19: pt able to sit for 6min and pain increases to 5-6/10;    Time 8    Period Weeks    Status Partially Met    Target Date 02/14/20      PT LONG TERM GOAL #2   Title Pt. will be able to lift 20 lbs from the ground to improve ability to complete work  related tasks.    Baseline 10/4: pt unalbe to lift 20lbs from ground. 11/1: pt able to lift 7lb box from chair with good technique; 12/20/19: Pt able to lift 20# box from chair height but she gets "pulling" in back;    Time 8    Period Weeks    Status Partially Met    Target Date 02/14/20      PT LONG TERM GOAL #3   Title Pt. will improve FOTO score to 60 to improve pain free functional mobility.    Baseline 10/4: 56; 11/1: 59; 12/20/19: 47    Time 8    Period Weeks    Status On-going    Target Date 02/14/20      PT LONG TERM GOAL #4   Title Pt. will demonstrate WFL spinal flexion to improve ability to complete ADLs.    Baseline 10/4: pt unable to tie shoes due to inadequate spinal flexion. 11/1: pt still unable to tie shoes due to inadequate spinal flexion. Spinal ext. looks improved.; 12/20/19: Pt unable to flex forward in sitting far enough to reach shoes due to increase in pain;    Time 8    Period Weeks    Status Partially Met    Target Date 02/14/20                 Plan - 01/25/20 1436    Clinical Impression Statement Continued with gentle pain-free AROM of lumbar spine today with seated lumbar rocking and pelvic tilts. Also continued with hip stretches. Progressed pt to additional standing exercises today with ankle weights. Overall pt appears to be improving with respect to her back pain and independent management. Will continue with pain modulation interventions until pt can tolerate additional strengthening without increase in her pain. She has not yet achieved maximal benefit from physical therapy. Pt will benefit from PT services to address deficits in pain in order to improve symptom-free function at home.    Personal Factors and Comorbidities Time since onset of injury/illness/exacerbation    Examination-Activity Limitations Bed Mobility;Caring for Others;Carry;Squat;Sit    Examination-Participation Restrictions Cleaning;Community Activity;Laundry    Stability/Clinical  Decision Making Evolving/Moderate complexity    Rehab Potential Fair    PT Frequency 2x / week    PT Duration 8 weeks    PT Treatment/Interventions ADLs/Self Care Home Management;Aquatic Therapy;Moist Heat;Parrafin;Gait training;Stair training;Functional mobility training;Neuromuscular re-education;Balance training;Therapeutic exercise;Therapeutic activities;Patient/family education;Scar mobilization;Passive range of motion;Manual techniques;Dry needling;Energy conservation;Joint Manipulations;Splinting;Taping;Spinal Manipulations;Cryotherapy    PT Next Visit Plan Continue chronic pain and posture education, hip, low back, and abdominal strengthening    Consulted and Agree with Plan of Care Patient  Patient will benefit from skilled therapeutic intervention in order to improve the following deficits and impairments:  Decreased balance,Decreased endurance,Decreased mobility,Abnormal gait,Hypomobility,Increased muscle spasms,Decreased range of motion,Improper body mechanics,Decreased activity tolerance,Decreased strength,Impaired flexibility,Postural dysfunction,Pain  Visit Diagnosis: Chronic midline low back pain with bilateral sciatica  Muscle weakness (generalized)     Problem List There are no problems to display for this patient.  Lyndel Safe Safiya Girdler PT, DPT, GCS  Armie Moren 01/25/2020, 3:15 PM  [facility] [address]. [place], [place], [address] Phone: [phone]   Fax:  [phone]  Name: Inza Mikrut MRN: [id number] Date of Birth: 1970-06-11

## 2020-01-30 ENCOUNTER — Ambulatory Visit: Payer: Medicaid Other

## 2020-01-30 NOTE — Patient Instructions (Incomplete)
TREATMENT   Ther-ex Seated marches x 20 BLE, cues for TrA activation; Seated adductor isometric ball squeeze 3s hold x 15; Seated clams with green tband 3s hold x 15; Seatedant/post pelvic tilts 5s hold x 10 each direction; Seated lateral pelvic tilts 5s hold x 10 each direction; Seated thoracic rotation stretch 30s hold x 2 bilateral; Seated HS stretch 2 x 30s BLE; Pball forward trunk flexion roll-outs5s hold x 10; Pball forward trunk flexion roll-outs with right and left bias5s hold x 10 each direction; Seated hip IR and ER stretches x 30s each bilateral,pt requires assist for LLE; Sit to stand with hands on knees x 5; Seated LAQ with 2# ankle weigths (AW); Standing exercises with 2# AW: Marches x 10 BLE; Hip abduction x 10 BLE; Hip extension x 10 BLE; HS curls x 10 BLE;   Pt educated throughout session about proper posture and technique with exercises. Improved exercise technique, movement at target joints, use of target muscles after min to mod verbal, visual, tactile cues.   Continued with gentle pain-free AROM of lumbar spine today withseatedlumbar rocking and pelvic tilts.Also continued with hip stretches. Progressed pt to additional standing exercises today with ankle weights. Overall pt appears to be improving with respect to her back pain and independent management.Will continue with pain modulation interventions until pt can tolerate additional strengthening without increase in her pain.She has not yet achieved maximal benefit from physical therapy.Pt will benefit from PT services to address deficits inpainin order to improve symptom-freefunction at home.

## 2020-02-01 NOTE — Patient Instructions (Incomplete)
TREATMENT   Ther-ex Seated marches x 20 BLE, cues for TrA activation; Seated adductor isometric ball squeeze 3s hold x 15; Seated clams with green tband 3s hold x 15; Seatedant/post pelvic tilts 5s hold x 10 each direction; Seated lateral pelvic tilts 5s hold x 10 each direction; Seated thoracic rotation stretch 30s hold x 2 bilateral; Seated HS stretch 2 x 30s BLE; Pball forward trunk flexion roll-outs5s hold x 10; Pball forward trunk flexion roll-outs with right and left bias5s hold x 10 each direction; Seated hip IR and ER stretches x 30s each bilateral,pt requires assist for LLE; Sit to stand with hands on knees x 5; Seated LAQ with 2# ankle weigths (AW); Standing exercises with 2# AW: Marches x 10 BLE; Hip abduction x 10 BLE; Hip extension x 10 BLE; HS curls x 10 BLE;   Pt educated throughout session about proper posture and technique with exercises. Improved exercise technique, movement at target joints, use of target muscles after min to mod verbal, visual, tactile cues.   Continued with gentle pain-free AROM of lumbar spine today withseatedlumbar rocking and pelvic tilts.Also continued with hip stretches. Progressed pt to additional standing exercises today with ankle weights. Overall pt appears to be improving with respect to her back pain and independent management.Will continue with pain modulation interventions until pt can tolerate additional strengthening without increase in her pain.She has not yet achieved maximal benefit from physical therapy.Pt will benefit from PT services to address deficits inpainin order to improve symptom-freefunction at home.     

## 2020-02-06 ENCOUNTER — Ambulatory Visit: Payer: Medicaid Other

## 2020-02-06 NOTE — Patient Instructions (Incomplete)
Update Goals   TREATMENT   Ther-ex Seated marches x 20 BLE, cues for TrA activation; Seated adductor isometric ball squeeze 3s hold x 15; Seated clams with green tband 3s hold x 15; Seatedant/post pelvic tilts 5s hold x 10 each direction; Seated lateral pelvic tilts 5s hold x 10 each direction; Seated thoracic rotation stretch 30s hold x 2 bilateral; Seated HS stretch 2 x 30s BLE; Pball forward trunk flexion roll-outs5s hold x 10; Pball forward trunk flexion roll-outs with right and left bias5s hold x 10 each direction; Seated hip IR and ER stretches x 30s each bilateral,pt requires assist for LLE; Sit to stand with hands on knees x 5; Seated LAQ with 2# ankle weigths (AW); Standing exercises with 2# AW: Marches x 10 BLE; Hip abduction x 10 BLE; Hip extension x 10 BLE; HS curls x 10 BLE;   Pt educated throughout session about proper posture and technique with exercises. Improved exercise technique, movement at target joints, use of target muscles after min to mod verbal, visual, tactile cues.   Continued with gentle pain-free AROM of lumbar spine today withseatedlumbar rocking and pelvic tilts.Also continued with hip stretches. Progressed pt to additional standing exercises today with ankle weights. Overall pt appears to be improving with respect to her back pain and independent management.Will continue with pain modulation interventions until pt can tolerate additional strengthening without increase in her pain.She has not yet achieved maximal benefit from physical therapy.Pt will benefit from PT services to address deficits inpainin order to improve symptom-freefunction at home.

## 2020-02-08 ENCOUNTER — Other Ambulatory Visit: Payer: Self-pay

## 2020-02-08 ENCOUNTER — Ambulatory Visit: Payer: Medicaid Other

## 2020-02-08 DIAGNOSIS — M6281 Muscle weakness (generalized): Secondary | ICD-10-CM

## 2020-02-08 DIAGNOSIS — G8929 Other chronic pain: Secondary | ICD-10-CM

## 2020-02-08 DIAGNOSIS — M5441 Lumbago with sciatica, right side: Secondary | ICD-10-CM | POA: Diagnosis not present

## 2020-02-08 NOTE — Therapy (Signed)
[place] [facility] [address] [place], [place], [address] Phone: [phone]   Fax:  [phone]  Physical Therapy Treatment/Discharge  Patient Details  Name: Dawn Oconnor MRN: [id number] Date of Birth: [birth year] Referring Provider (PT): Girtha Hake, MD   Encounter Date: 02/08/2020   PT End of Session - 02/08/20 1519    Visit Number 23    Number of Visits 33    Date for PT Re-Evaluation 02/14/20    Authorization Type medicaid    PT Start Time 4235    PT Stop Time 1550    PT Time Calculation (min) 33 min    Activity Tolerance Patient limited by pain;Patient tolerated treatment well    Behavior During Therapy [facility] for tasks assessed/performed           History reviewed. No pertinent past medical history.  History reviewed. No pertinent surgical history.  There were no vitals filed for this visit.   Subjective Assessment - 02/08/20 1518    Subjective Pt complains of 7-8/10 low back pain upon arrival. Her back pain has been intermittent since her last therapy session. She is inconsistent with her home exercises. No specific concerns at this time.    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 5-10 mins    Diagnostic tests MRI spine, clear    Patient Stated Goals relieve pain    Currently in Pain? Yes    Pain Score 8     Pain Location Back    Pain Orientation Right;Left;Lower    Pain Descriptors / Indicators Aching    Pain Onset More than a month ago    Pain Frequency Constant              TREATMENT   Ther-ex NuStep L0 x 5 minutes for warm-up during history and goal review (2 minutes unbilled); Updated outcome measures with patient: FOTO: 47 Pt lifted 20# box from floor to waist height, unable to flex forward far enough in sitting to reach shoes to tie laces; Seated marches x 20 BLE, cues for TrA  activation; Seated adductor isometric ball squeeze 3s hold x 20; Seated clams with green tband 3s hold x 15; Seatedant/post pelvic tilts 5s hold x 10 each direction; Seated thoracic rotation stretch 30s hold x 2 bilateral; Seated HS stretch 2 x 30s BLE; Issued HEP and discharge instructions provided   Pt educated throughout session about proper posture and technique with exercises. Improved exercise technique, movement at target joints, use of target muscles after min to mod verbal, visual, tactile cues.   Updated outcome measures and goals with patient today.  Patient reports that she is still only able to sit for about 15 minutes in the car without her pain increasing to a 6-7/10.  She is able to lift 20 pound box from the floor but demonstrates poor lifting mechanics and a significant increase in her back pain.  She continues to lack adequate spinal flexion in sitting to reach her feet in order to tie her shoes.  Her FOTO score remains unchanged at 47 today which worse than her initial score of 56.  At this time patient has failed conservative management for her back pain with physical therapy.  She struggles to be consistent with her home exercises because of increase in pain.  Recommend patient follow-up with her referring MD, Dr. Alba Destine, in order to  discuss further options for management of her back pain.  Patient will be discharged on this date due to lack of improvement from physical therapy interventions.                                PT Long Term Goals - 02/08/20 1522      PT LONG TERM GOAL #1   Title Pt. will demonstrate handling at least 15 minutes of sitting with pain less than 4/10 to handle to commute to work.    Baseline 10/4: pt unalbe to sit for more than 5 minute without pain up to 8/10. 11/1: pt able to sit for approx 15 mins with pain no more than 5-7/10; 12/20/19: pt able to sit for 43min and pain increases to 5-6/10; 02/08/20: 15 minutes  pain increases 6-7/10    Time 8    Period Weeks    Status Not Met      PT LONG TERM GOAL #2   Title Pt. will be able to lift 20 lbs from the ground to improve ability to complete work related tasks.    Baseline 10/4: pt unable to lift 20lbs from ground. 11/1: pt able to lift 7lb box from chair with good technique; 12/20/19: Pt able to lift 20# box from chair height but she gets "pulling" in back; 02/08/20: Pt able to lift 20# box from floor but poor lifting mechanics and increase in back pain    Time 8    Period Weeks    Status Not Met      PT LONG TERM GOAL #3   Title Pt. will improve FOTO score to 60 to improve pain free functional mobility.    Baseline 10/4: 56; 11/1: 59; 12/20/19: 47; 02/08/20: 47    Time 8    Period Weeks    Status Not Met      PT LONG TERM GOAL #4   Title Pt. will demonstrate WFL spinal flexion to improve ability to complete ADLs.    Baseline 10/4: pt unable to tie shoes due to inadequate spinal flexion. 11/1: pt still unable to tie shoes due to inadequate spinal flexion. Spinal ext. looks improved.; 12/20/19: Pt unable to flex forward in sitting far enough to reach shoes due to increase in pain; 02/08/20: Pt unable to flex forward in sitting far enough to reach shoes due to increase in pain;    Time 8    Period Weeks    Status Not Met                 Plan - 02/08/20 1520    Clinical Impression Statement Updated outcome measures and goals with patient today.  Patient reports that she is still only able to sit for about 15 minutes in the car without her pain increasing to a 6-7/10.  She is able to lift 20 pound box from the floor but demonstrates poor lifting mechanics and a significant increase in her back pain.  She continues to lack adequate spinal flexion in sitting to reach her feet in order to tie her shoes.  Her FOTO score remains unchanged at 47 today which worse than her initial score of 56.  At this time patient has failed conservative management for her  back pain with physical therapy.  She struggles to be consistent with her home exercises because of increase in pain.  Recommend patient follow-up with her referring MD, Dr. Alba Destine, in order  to discuss further options for management of her back pain.  Patient will be discharged on this date due to lack of improvement from physical therapy interventions.    Personal Factors and Comorbidities Time since onset of injury/illness/exacerbation    Examination-Activity Limitations Bed Mobility;Caring for Others;Carry;Squat;Sit    Examination-Participation Restrictions Cleaning;Community Activity;Laundry    Stability/Clinical Decision Making Evolving/Moderate complexity    Rehab Potential Fair    PT Frequency 2x / week    PT Duration 8 weeks    PT Treatment/Interventions ADLs/Self Care Home Management;Aquatic Therapy;Moist Heat;Parrafin;Gait training;Stair training;Functional mobility training;Neuromuscular re-education;Balance training;Therapeutic exercise;Therapeutic activities;Patient/family education;Scar mobilization;Passive range of motion;Manual techniques;Dry needling;Energy conservation;Joint Manipulations;Splinting;Taping;Spinal Manipulations;Cryotherapy    PT Next Visit Plan Continue chronic pain and posture education, hip, low back, and abdominal strengthening    Consulted and Agree with Plan of Care Patient           Patient will benefit from skilled therapeutic intervention in order to improve the following deficits and impairments:  Decreased balance,Decreased endurance,Decreased mobility,Abnormal gait,Hypomobility,Increased muscle spasms,Decreased range of motion,Improper body mechanics,Decreased activity tolerance,Decreased strength,Impaired flexibility,Postural dysfunction,Pain  Visit Diagnosis: Chronic midline low back pain with bilateral sciatica  Muscle weakness (generalized)     Problem List There are no problems to display for this patient.  Lyndel Safe Jenean Escandon PT, DPT, GCS   Vern Guerette 02/08/2020, 4:11 PM  [facility] [address]. [place], [place], [address] Phone: [phone]   Fax:  [phone]  Name: Dawn Oconnor MRN: [id number] Date of Birth: 12-31-1970

## 2020-02-08 NOTE — Patient Instructions (Signed)
Access Code: [email or web address] URL: [email or web address] Date: 02/08/2020 Prepared by: Ria Comment  Exercises Seated Thoracic Flexion and Rotation with Arms Crossed - 2 x daily - 7 x weekly - 3 reps - 30s hold Seated Hamstring Stretch - 2 x daily - 7 x weekly - 3 reps - 30s hold Seated Pelvic Tilts - 1 x daily - 7 x weekly - 2 sets - 10 reps - 3-5s hold Seated March - 1 x daily - 7 x weekly - 2 sets - 10 reps - 3-5s hold Seated Hip Abduction with Resistance - 1 x daily - 7 x weekly - 2 sets - 10 reps - 3-5s hold Seated Hip Adduction Squeeze with Ball - 1 x daily - 7 x weekly - 2 sets - 10 reps - 3-5s hold

## 2020-02-13 ENCOUNTER — Ambulatory Visit: Payer: Medicaid Other

## 2020-08-13 ENCOUNTER — Ambulatory Visit (LOCAL_COMMUNITY_HEALTH_CENTER): Payer: Self-pay

## 2020-08-13 ENCOUNTER — Other Ambulatory Visit: Payer: Self-pay

## 2020-08-13 DIAGNOSIS — Z111 Encounter for screening for respiratory tuberculosis: Secondary | ICD-10-CM

## 2020-08-16 ENCOUNTER — Ambulatory Visit (LOCAL_COMMUNITY_HEALTH_CENTER): Payer: Medicaid Other

## 2020-08-16 ENCOUNTER — Other Ambulatory Visit: Payer: Self-pay

## 2020-08-16 DIAGNOSIS — Z111 Encounter for screening for respiratory tuberculosis: Secondary | ICD-10-CM

## 2020-08-16 LAB — TB SKIN TEST
Induration: 0 mm
TB Skin Test: NEGATIVE

## 2020-12-23 ENCOUNTER — Other Ambulatory Visit: Payer: Self-pay | Admitting: Family Medicine

## 2020-12-23 DIAGNOSIS — Z1231 Encounter for screening mammogram for malignant neoplasm of breast: Secondary | ICD-10-CM

## 2021-02-04 ENCOUNTER — Other Ambulatory Visit: Payer: Self-pay

## 2021-02-04 ENCOUNTER — Ambulatory Visit
Admission: RE | Admit: 2021-02-04 | Discharge: 2021-02-04 | Disposition: A | Discharge status: Home / Self Care | Payer: Medicaid Other | Source: Ambulatory Visit | Attending: Family Medicine | Admitting: Family Medicine

## 2021-02-04 DIAGNOSIS — Z1231 Encounter for screening mammogram for malignant neoplasm of breast: Secondary | ICD-10-CM | POA: Insufficient documentation

## 2021-04-15 IMAGING — MG DIGITAL SCREENING BILAT W/ TOMO W/ CAD
8 series · 8 of 24 positions shown · non-contrast
Comparison: Previous exam(s).

CLINICAL DATA: Screening.

EXAM:
DIGITAL SCREENING BILATERAL MAMMOGRAM WITH TOMO AND CAD

[L MLO synth-2D]
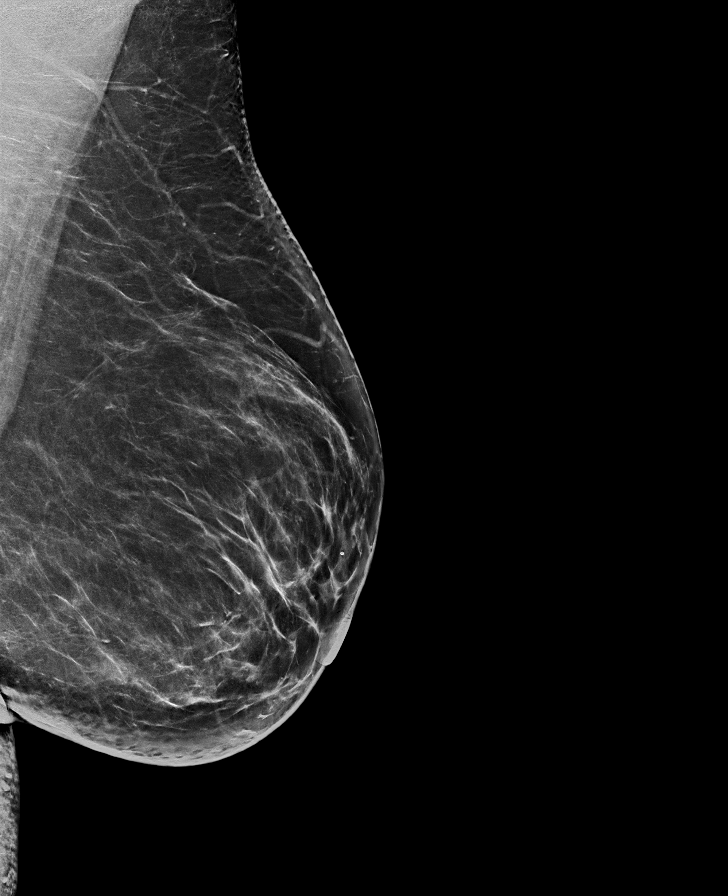

[R CC synth-2D]
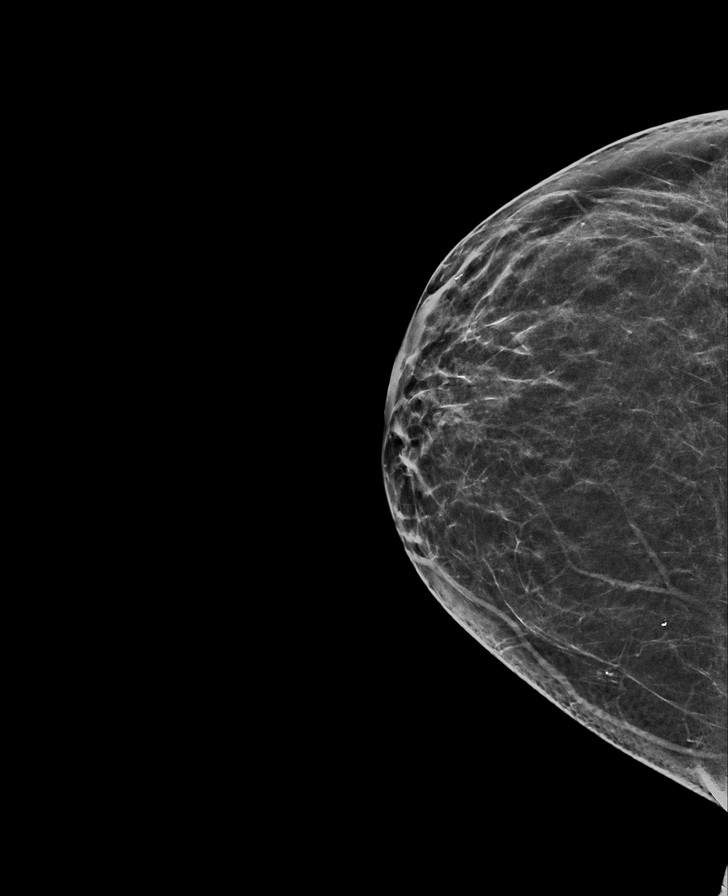

[R MLO synth-2D]
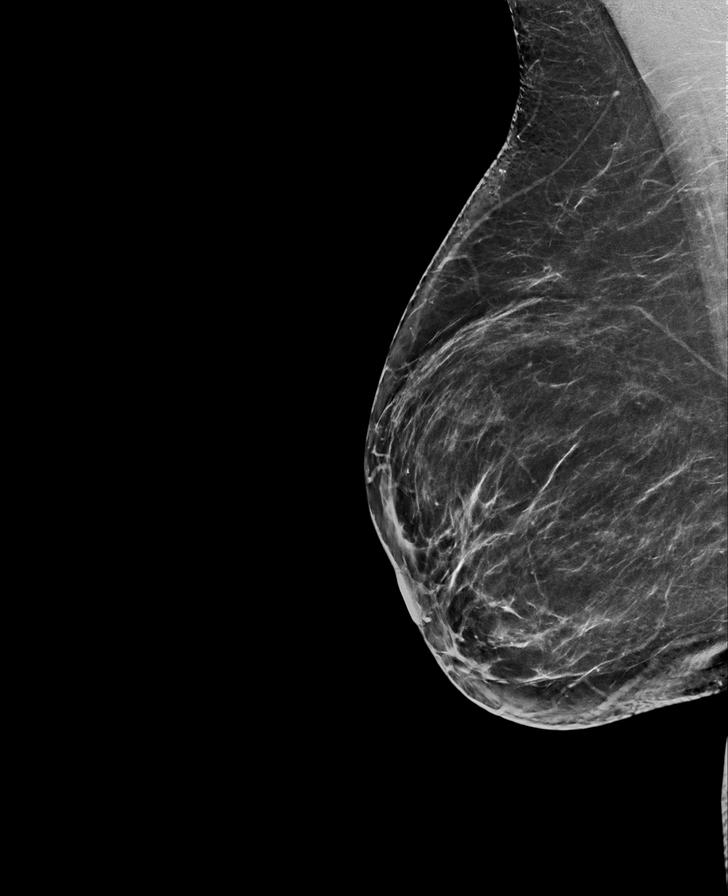

[L CC synth-2D]
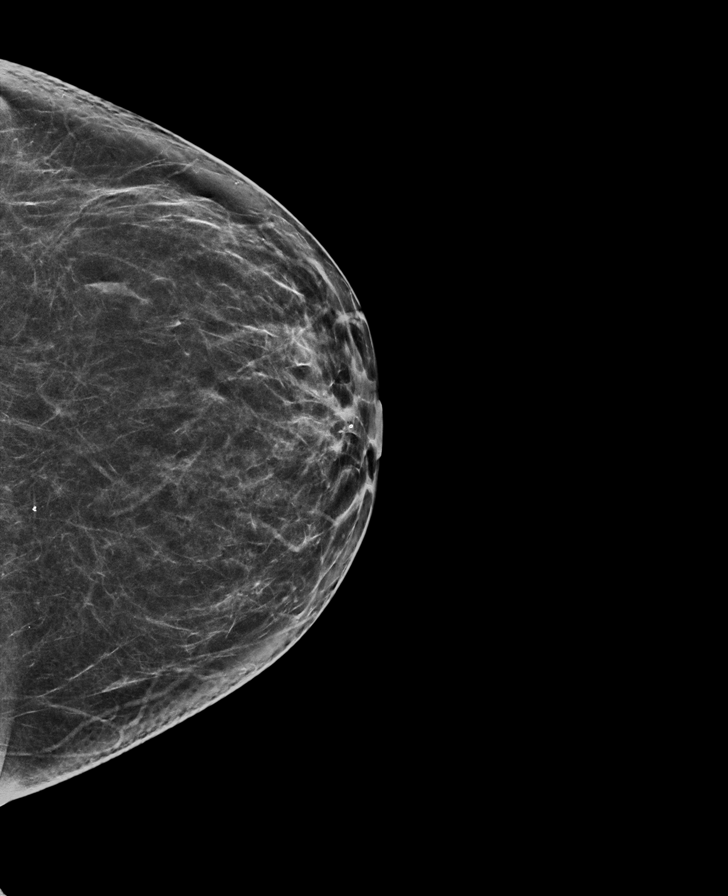

[R CC tomo · tomo slice 33/66.0]
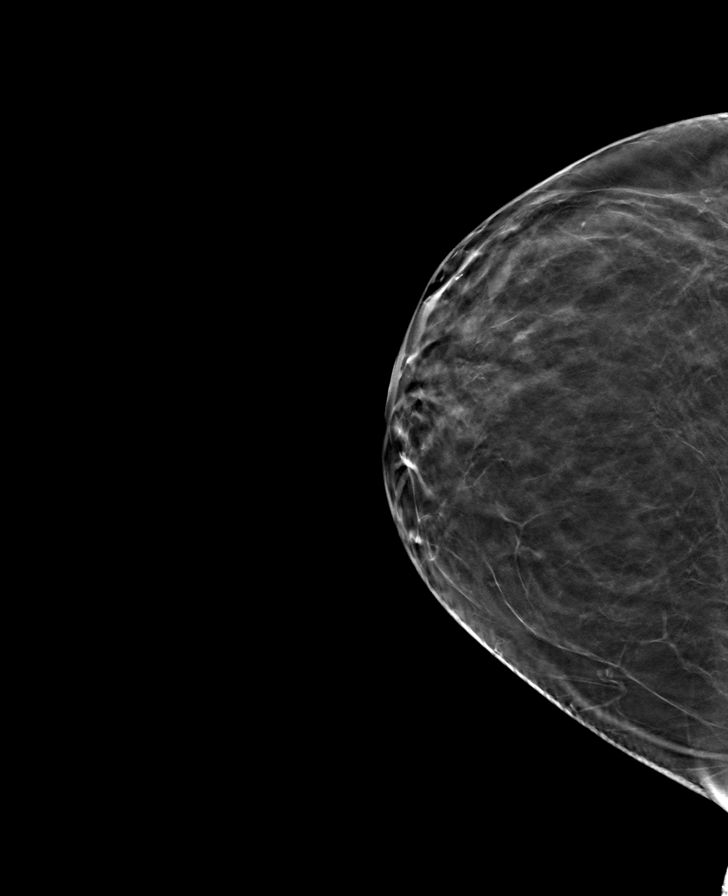

[R MLO tomo · tomo slice 40/79.0]
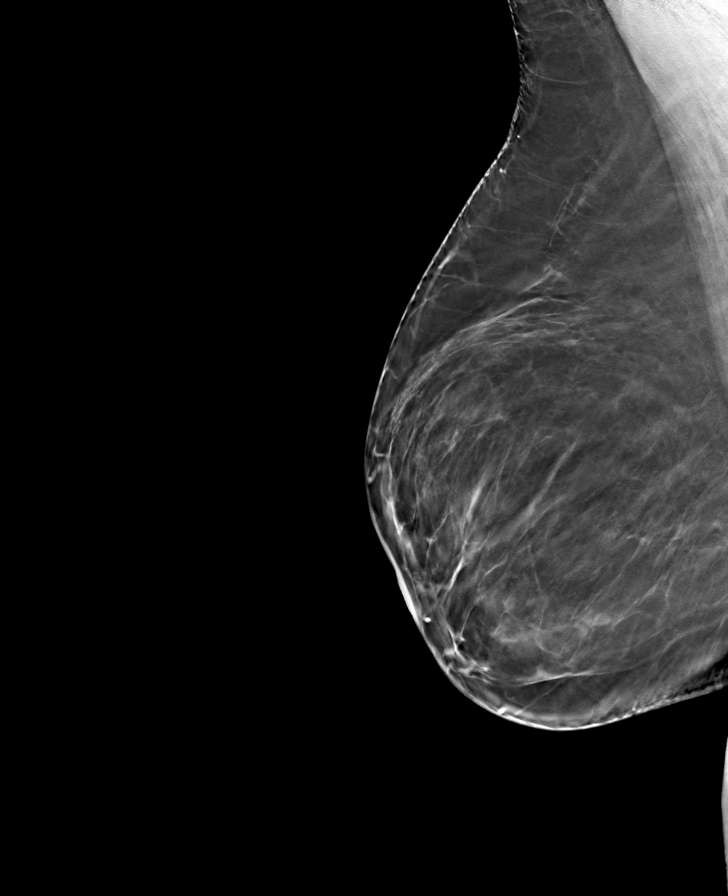

[L CC tomo · tomo slice 37/73.0]
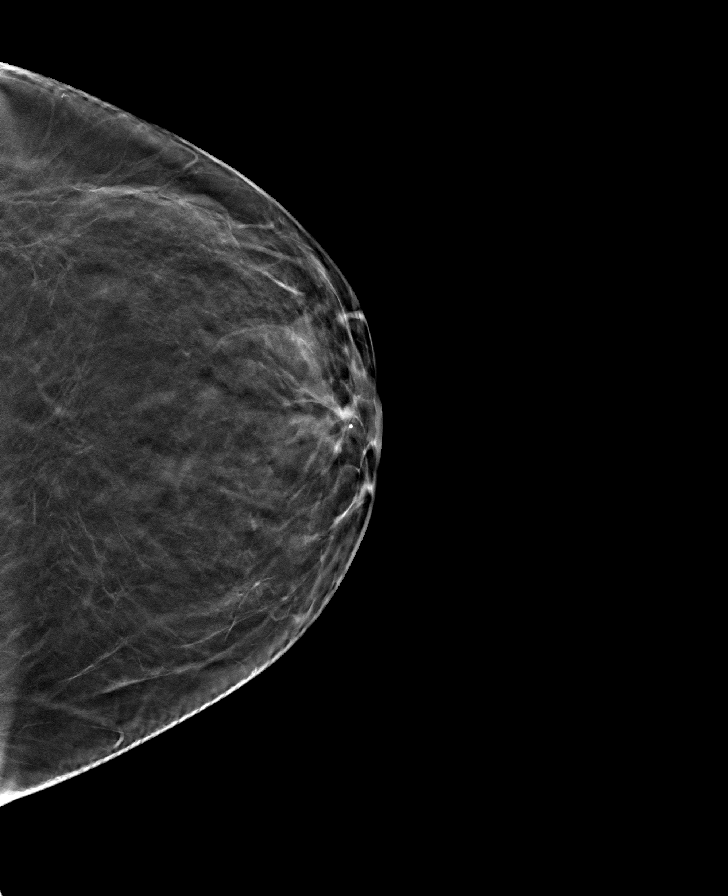

[L MLO tomo · tomo slice 43/85.0]
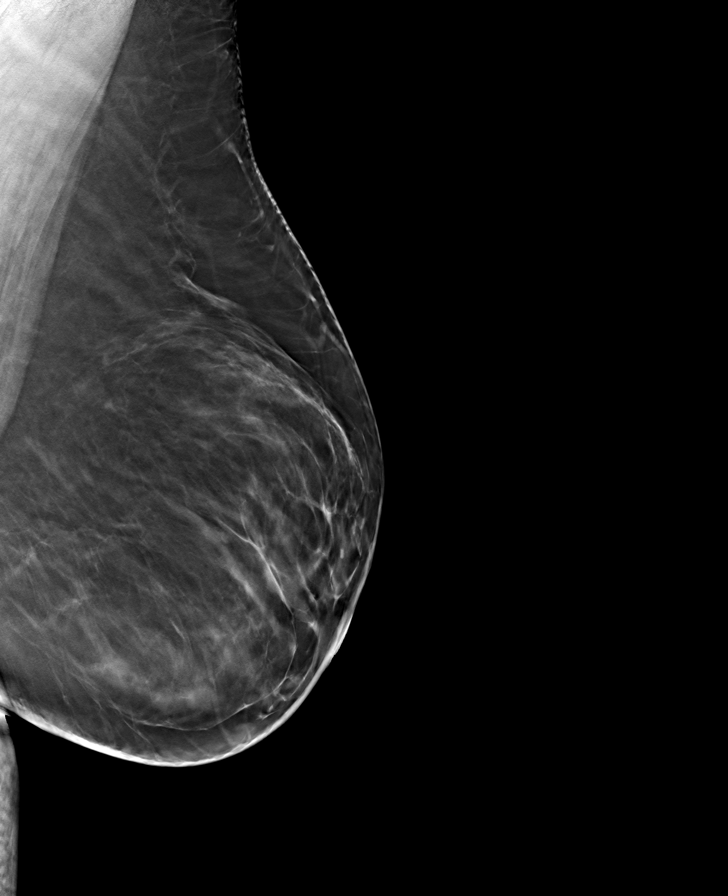

[8 of 24 positions shown; findings below may reference images not displayed]

ACR Breast Density Category b: There are scattered areas of
fibroglandular density.
FINDINGS: There are no findings suspicious for malignancy. Images were
processed with CAD.
IMPRESSION: No mammographic evidence of malignancy. A result letter of this
screening mammogram will be mailed directly to the patient.

RECOMMENDATION:
Screening mammogram in one year. (Code:[id number])

BI-RADS CATEGORY  1: Negative.

## 2022-08-20 ENCOUNTER — Ambulatory Visit (INDEPENDENT_AMBULATORY_CARE_PROVIDER_SITE_OTHER): Payer: Medicaid Other | Admitting: Podiatry

## 2022-08-20 DIAGNOSIS — M7752 Other enthesopathy of left foot: Secondary | ICD-10-CM | POA: Diagnosis not present

## 2022-08-20 MED ORDER — METHYLPREDNISOLONE 4 MG PO TBPK: ORAL_TABLET | ORAL | 0 refills | Status: AC

## 2022-08-20 MED ORDER — MELOXICAM 15 MG PO TABS: 15.0000 mg | ORAL_TABLET | Freq: Every day | ORAL | 0 refills | Status: DC

## 2022-08-20 NOTE — Addendum Note (Signed)
Addended by: Nicholes Rough on: 08/20/2022 09:29 AM   Modules accepted: Orders

## 2022-08-20 NOTE — Progress Notes (Signed)
  Subjective:  Patient ID: Dawn Oconnor, female    DOB: Jun 26, 1970,  MRN: [id number]  Chief Complaint  Patient presents with   Numbness    Pt stated she has a lot numbness tingling and swelling that happens with both feet     52 y.o. female presents with the above complaint.  Patient presents with complaint of left ankle pain circumferentially.  Patient states the pain that it came out of nowhere is progressive gotten worse.  Nothing has helped.  She wanted to discuss treatment options for it.  Hurts with ambulation worse with pressure pain scale 7 out of 10 dull achy in nature.   Review of Systems: Negative except as noted in the HPI. Denies N/V/F/Ch.  No past medical history on file.  Current Outpatient Medications:    diclofenac (VOLTAREN) 0.1 % ophthalmic solution, 4 (four) times daily. (Patient not taking: Reported on 08/13/2020), Disp: , Rfl:    diclofenac sodium (VOLTAREN) 1 % GEL, Apply topically as needed. (Patient not taking: Reported on 08/13/2020), Disp: , Rfl:    gabapentin (NEURONTIN) 100 MG capsule, Take 10 mg by mouth as needed (at night). (Patient not taking: Reported on 08/13/2020), Disp: , Rfl:    naproxen (NAPROSYN) 125 MG/5ML suspension, Take 500 mg by mouth as needed.  (Patient not taking: Reported on 08/13/2020), Disp: , Rfl:   Social History   Tobacco Use  Smoking Status Never  Smokeless Tobacco Never    No Known Allergies Objective:  There were no vitals filed for this visit. There is no height or weight on file to calculate BMI. Constitutional Well developed. Well nourished.  Vascular Dorsalis pedis pulses palpable bilaterally. Posterior tibial pulses palpable bilaterally. Capillary refill normal to all digits.  No cyanosis or clubbing noted. Pedal hair growth normal.  Neurologic Normal speech. Oriented to person, place, and time. Epicritic sensation to light touch grossly present bilaterally.  Dermatologic Nails well groomed and normal in  appearance. No open wounds. No skin lesions.  Orthopedic: Pain on palpation to the left ankle pain with range of motion of the ankle joint.  Deep intra-articular ankle pain noted.  Pain at the ATFL ligament mild pain at the Achilles tendon insertion as well as posterior tibial tendon.   Radiographs: None Assessment:   1. Capsulitis of ankle, left    Plan:  Patient was evaluated and treated and all questions answered.  Left ankle generalized pain with no focalized tenderness with possible Achilles tendinitis -All questions are considered concerns were discussed with the patient in extensive detail -Given the amount of pain that she is having she will benefit from cam boot immobilization to allow the ankle joint to completely be immobilized and allow the pain to be more localized.  I discussed with patient she states understanding -If there are still some residual pain we will discuss steroid injection during next visit  No follow-ups on file.

## 2022-09-16 ENCOUNTER — Other Ambulatory Visit: Payer: Self-pay | Admitting: Podiatry

## 2022-09-17 ENCOUNTER — Encounter: Payer: Self-pay | Admitting: Podiatry

## 2022-09-17 ENCOUNTER — Ambulatory Visit: Payer: Medicaid Other | Admitting: Podiatry

## 2022-09-17 DIAGNOSIS — M722 Plantar fascial fibromatosis: Secondary | ICD-10-CM

## 2022-09-17 DIAGNOSIS — Z79899 Other long term (current) drug therapy: Secondary | ICD-10-CM

## 2022-09-17 DIAGNOSIS — B351 Tinea unguium: Secondary | ICD-10-CM | POA: Diagnosis not present

## 2022-09-17 NOTE — Progress Notes (Signed)
Subjective:  Patient ID: Dawn Oconnor, female    DOB: [birth year],  MRN: [id number]  Chief Complaint  Patient presents with   Foot Pain    Pt stated that her ankle is better but she has been noticing some discomfort in the left heel when she applies pressure     52 y.o. female presents with the above complaint.  Patient presents with primary complaint of left heel pain that started out of nowhere has been hurting her she feels a lot of pressure and pain she wanted to discuss treatment options for which she has not seen and was purchasing it.  She has secondary complaint of bilateral hallux thickened and dystrophic mycotic toenails nail fungus.  She has tried over-the-counter options including Vicks vapor rub which has not helped.  She would like to discuss treatment options for this as well.   Review of Systems: Negative except as noted in the HPI. Denies N/V/F/Ch.  History reviewed. No pertinent past medical history.  Current Outpatient Medications:    doxycycline (MONODOX) 100 MG capsule, Take 100 mg by mouth 2 (two) times daily., Disp: , Rfl:    methocarbamol (ROBAXIN) 500 MG tablet, Take by mouth., Disp: , Rfl:    triamcinolone ointment (KENALOG) 0.1 %, Apply 1 Application topically 2 (two) times daily., Disp: , Rfl:    Albuterol Sulfate, sensor, 108 (90 Base) MCG/ACT AEPB, Inhale into the lungs., Disp: , Rfl:    cyclobenzaprine (FLEXERIL) 10 MG tablet, Take by mouth., Disp: , Rfl:    diclofenac (VOLTAREN) 0.1 % ophthalmic solution, 4 (four) times daily. (Patient not taking: Reported on 08/13/2020), Disp: , Rfl:    diclofenac sodium (VOLTAREN) 1 % GEL, Apply topically as needed. (Patient not taking: Reported on 08/13/2020), Disp: , Rfl:    gabapentin (NEURONTIN) 100 MG capsule, Take 10 mg by mouth as needed (at night). (Patient not taking: Reported on 08/13/2020), Disp: , Rfl:    lidocaine (LIDODERM) 5 %, SMARTSIG:Topical, Disp: , Rfl:    meloxicam (MOBIC) 15 MG tablet, TAKE 1 TABLET (15  MG TOTAL) BY MOUTH DAILY., Disp: 30 tablet, Rfl: 0   methylPREDNISolone (MEDROL DOSEPAK) 4 MG TBPK tablet, Take as directed, Disp: 21 each, Rfl: 0   naproxen (NAPROSYN) 125 MG/5ML suspension, Take 500 mg by mouth as needed.  (Patient not taking: Reported on 08/13/2020), Disp: , Rfl:   Social History   Tobacco Use  Smoking Status Never  Smokeless Tobacco Never    No Known Allergies Objective:  There were no vitals filed for this visit. There is no height or weight on file to calculate BMI. Constitutional Well developed. Well nourished.  Vascular Dorsalis pedis pulses palpable bilaterally. Posterior tibial pulses palpable bilaterally. Capillary refill normal to all digits.  No cyanosis or clubbing noted. Pedal hair growth normal.  Neurologic Normal speech. Oriented to person, place, and time. Epicritic sensation to light touch grossly present bilaterally.  Dermatologic Nails well groomed and normal in appearance. No open wounds. No skin lesions.  Orthopedic: Normal joint ROM without pain or crepitus bilaterally. No visible deformities. Tender to palpation at the calcaneal tuber left. No pain with calcaneal squeeze left. Ankle ROM diminished range of motion left. Silfverskiold Test: positive left.   Radiographs: None  Assessment:   1. Long-term use of high-risk medication   2. Nail fungus   3. Onychomycosis due to dermatophyte   4. Plantar fasciitis of left foot    Plan:  Patient was evaluated and treated and all questions answered.  Plantar  Fasciitis, left - XR reviewed as above.  - Educated on icing and stretching. Instructions given.  - Injection delivered to the plantar fascia as below. - DME: Plantar fascial brace dispensed to support the medial longitudinal arch of the foot and offload pressure from the heel and prevent arch collapse during weightbearing - Pharmacologic management: None  Bilateral hallux onychomycosis -Educated the patient on the etiology of  onychomycosis and various treatment options associated with improving the fungal load.  I explained to the patient that there is 3 treatment options available to treat the onychomycosis including topical, p.o., laser treatment.  Patient elected to undergo p.o. options with Lamisil/terbinafine therapy.  In order for me to start the medication therapy, I explained to the patient the importance of evaluating the liver and obtaining the liver function test.  Once the liver function test comes back normal I will start him on 9-month course of Lamisil therapy.  Patient understood all risk and would like to proceed with Lamisil therapy.  I have asked the patient to immediately stop the Lamisil therapy if she has any reactions to it and call the office or go to the emergency room right away.  Patient states understanding   Procedure: Injection Tendon/Ligament Location: Left plantar fascia at the glabrous junction; medial approach. Skin Prep: alcohol Injectate: 0.5 cc 0.5% marcaine plain, 0.5 cc of 1% Lidocaine, 0.5 cc kenalog 10. Disposition: Patient tolerated procedure well. Injection site dressed with a band-aid.  No follow-ups on file.

## 2022-09-18 LAB — HEPATIC FUNCTION PANEL
ALT: 13 IU/L (ref 0–32)
AST: 13 IU/L (ref 0–40)
Albumin: 4.2 g/dL (ref 3.8–4.9)
Alkaline Phosphatase: 70 IU/L (ref 44–121)
Bilirubin Total: 0.3 mg/dL (ref 0.0–1.2)
Bilirubin, Direct: 0.12 mg/dL (ref 0.00–0.40)
Total Protein: 6.9 g/dL (ref 6.0–8.5)

## 2022-09-22 MED ORDER — TERBINAFINE HCL 250 MG PO TABS: 250.0000 mg | ORAL_TABLET | Freq: Every day | ORAL | 0 refills | Status: AC

## 2022-09-22 NOTE — Addendum Note (Signed)
Addended by: Nicholes Rough on: 09/22/2022 08:12 AM   Modules accepted: Orders

## 2022-10-08 ENCOUNTER — Other Ambulatory Visit: Payer: Self-pay | Admitting: Otolaryngology

## 2022-10-08 ENCOUNTER — Inpatient Hospital Stay
Admission: RE | Admit: 2022-10-08 | Discharge: 2022-10-08 | Disposition: A | Discharge status: Home / Self Care | Payer: Self-pay | Source: Ambulatory Visit | Attending: Otolaryngology | Admitting: Otolaryngology

## 2022-10-08 DIAGNOSIS — E041 Nontoxic single thyroid nodule: Secondary | ICD-10-CM

## 2022-10-22 NOTE — Progress Notes (Signed)
Oley Balm, MD sent to [facility] PROCEDURE / BIOPSY REVIEW Date: 10/21/22  Requested Biopsy site: R mid thyroid nodule Reason for request: tr3 Imaging review: Best seen on [place] 09/15/22  Decision: Approved Imaging modality to perform: Ultrasound Schedule with: No sedation / Local anesthetic Schedule for: Any VIR  Additional comments:   Please contact me with questions, concerns, or if issue pertaining to this request arise.  Dayne Oley Balm, MD [facility]

## 2022-10-23 ENCOUNTER — Other Ambulatory Visit: Payer: Self-pay | Admitting: Otolaryngology

## 2022-10-23 DIAGNOSIS — E041 Nontoxic single thyroid nodule: Secondary | ICD-10-CM

## 2022-11-05 NOTE — Progress Notes (Signed)
Patient for US guided FNA RT Mid Thyroid Nodule Biopsy on Friday 11/06/2022, I called and spoke with the patient on the phone and gave pre-procedure instructions. Pt was made aware to be here at 12:30p and check in at the [facility]. Pt stated understanding.  Called 11/05/2022

## 2022-11-06 ENCOUNTER — Ambulatory Visit
Admission: RE | Admit: 2022-11-06 | Discharge: 2022-11-06 | Disposition: A | Discharge status: Home / Self Care | Payer: Medicaid Other | Source: Ambulatory Visit | Attending: Otolaryngology | Admitting: Otolaryngology

## 2022-11-06 DIAGNOSIS — E041 Nontoxic single thyroid nodule: Secondary | ICD-10-CM | POA: Diagnosis present

## 2022-11-06 MED ORDER — LIDOCAINE HCL (PF) 1 % IJ SOLN
10.0000 mL | Freq: Once | INTRAMUSCULAR | Status: AC
  Administered 2022-11-06: 10 mL via INTRADERMAL
  Filled 2022-11-06: qty 10

## 2022-11-10 LAB — CYTOLOGY - NON PAP

## 2023-06-07 ENCOUNTER — Other Ambulatory Visit: Payer: Self-pay | Admitting: Family Medicine

## 2023-06-07 DIAGNOSIS — Z1231 Encounter for screening mammogram for malignant neoplasm of breast: Secondary | ICD-10-CM

## 2023-08-03 ENCOUNTER — Ambulatory Visit
Admission: RE | Admit: 2023-08-03 | Discharge: 2023-08-03 | Disposition: A | Discharge status: Home / Self Care | Source: Ambulatory Visit | Attending: Family Medicine | Admitting: Family Medicine

## 2023-08-03 ENCOUNTER — Encounter: Payer: Self-pay | Admitting: Radiology

## 2023-08-03 DIAGNOSIS — Z1231 Encounter for screening mammogram for malignant neoplasm of breast: Secondary | ICD-10-CM | POA: Diagnosis present
